# Patient Record
Sex: Female | Born: 1975 | Hispanic: Yes | Marital: Married | Smoking: Never smoker
Health system: Southern US, Community
[De-identification: ages and names within clinical notes are randomized; demographics above are authoritative.]

## PROBLEM LIST (undated history)

## (undated) DIAGNOSIS — J45909 Unspecified asthma, uncomplicated: Secondary | ICD-10-CM

---

## 2004-05-02 ENCOUNTER — Ambulatory Visit: Payer: Self-pay | Admitting: Family Medicine

## 2004-10-04 ENCOUNTER — Observation Stay: Payer: Self-pay

## 2004-10-05 ENCOUNTER — Inpatient Hospital Stay: Payer: Self-pay | Admitting: Obstetrics and Gynecology

## 2006-08-19 ENCOUNTER — Ambulatory Visit: Payer: Self-pay | Admitting: Family Medicine

## 2007-01-17 ENCOUNTER — Ambulatory Visit: Payer: Self-pay | Admitting: Obstetrics and Gynecology

## 2007-01-20 ENCOUNTER — Inpatient Hospital Stay: Payer: Self-pay | Admitting: Obstetrics and Gynecology

## 2009-11-14 ENCOUNTER — Encounter: Payer: Self-pay | Admitting: Obstetrics and Gynecology

## 2009-12-12 ENCOUNTER — Encounter: Payer: Self-pay | Admitting: Maternal and Fetal Medicine

## 2010-01-16 ENCOUNTER — Encounter: Payer: Self-pay | Admitting: Maternal & Fetal Medicine

## 2010-06-07 ENCOUNTER — Inpatient Hospital Stay: Payer: Self-pay | Admitting: Obstetrics and Gynecology

## 2010-06-12 LAB — PATHOLOGY REPORT

## 2015-03-18 DIAGNOSIS — N920 Excessive and frequent menstruation with regular cycle: Secondary | ICD-10-CM | POA: Diagnosis present

## 2015-03-18 DIAGNOSIS — K5712 Diverticulitis of small intestine without perforation or abscess without bleeding: Principal | ICD-10-CM | POA: Diagnosis present

## 2015-03-19 ENCOUNTER — Emergency Department: Payer: Self-pay

## 2015-03-19 ENCOUNTER — Inpatient Hospital Stay
Admission: EM | Admit: 2015-03-19 | Discharge: 2015-03-20 | DRG: 392 | Disposition: A | Discharge status: Home / Self Care | Payer: Self-pay | Attending: Surgery | Admitting: Surgery

## 2015-03-19 ENCOUNTER — Other Ambulatory Visit: Payer: Self-pay

## 2015-03-19 ENCOUNTER — Inpatient Hospital Stay: Payer: Self-pay

## 2015-03-19 DIAGNOSIS — K56609 Unspecified intestinal obstruction, unspecified as to partial versus complete obstruction: Secondary | ICD-10-CM | POA: Diagnosis present

## 2015-03-19 DIAGNOSIS — R1032 Left lower quadrant pain: Secondary | ICD-10-CM

## 2015-03-19 DIAGNOSIS — R109 Unspecified abdominal pain: Secondary | ICD-10-CM | POA: Insufficient documentation

## 2015-03-19 DIAGNOSIS — K5669 Other intestinal obstruction: Secondary | ICD-10-CM

## 2015-03-19 HISTORY — DX: Unspecified asthma, uncomplicated: J45.909

## 2015-03-19 LAB — COMPREHENSIVE METABOLIC PANEL
ALK PHOS: 46 U/L (ref 38–126)
ALT: 18 U/L (ref 14–54)
ANION GAP: 9 (ref 5–15)
AST: 20 U/L (ref 15–41)
Albumin: 4 g/dL (ref 3.5–5.0)
BILIRUBIN TOTAL: 0.7 mg/dL (ref 0.3–1.2)
BUN: 24 mg/dL — ABNORMAL HIGH (ref 6–20)
CALCIUM: 9.1 mg/dL (ref 8.9–10.3)
CO2: 24 mmol/L (ref 22–32)
CREATININE: 0.64 mg/dL (ref 0.44–1.00)
Chloride: 105 mmol/L (ref 101–111)
Glucose, Bld: 178 mg/dL — ABNORMAL HIGH (ref 65–99)
Potassium: 3.6 mmol/L (ref 3.5–5.1)
SODIUM: 138 mmol/L (ref 135–145)
Total Protein: 7.1 g/dL (ref 6.5–8.1)

## 2015-03-19 LAB — URINALYSIS COMPLETE WITH MICROSCOPIC (ARMC ONLY)
Bilirubin Urine: NEGATIVE
Glucose, UA: NEGATIVE mg/dL
Hgb urine dipstick: NEGATIVE
Leukocytes, UA: NEGATIVE
Nitrite: NEGATIVE
PROTEIN: 100 mg/dL — AB
Specific Gravity, Urine: 1.028 (ref 1.005–1.030)
pH: 8 (ref 5.0–8.0)

## 2015-03-19 LAB — DIFFERENTIAL
BASOS ABS: 0.1 10*3/uL (ref 0–0.1)
Basophils Relative: 1 %
Eosinophils Absolute: 0 10*3/uL (ref 0–0.7)
Eosinophils Relative: 0 %
LYMPHS PCT: 9 %
Lymphs Abs: 1.3 10*3/uL (ref 1.0–3.6)
Monocytes Absolute: 0.5 10*3/uL (ref 0.2–0.9)
Monocytes Relative: 3 %
NEUTROS ABS: 13.7 10*3/uL — AB (ref 1.4–6.5)
NEUTROS PCT: 87 %

## 2015-03-19 LAB — CHLAMYDIA/NGC RT PCR (ARMC ONLY)
CHLAMYDIA TR: NOT DETECTED
N GONORRHOEAE: NOT DETECTED

## 2015-03-19 LAB — CBC
HEMATOCRIT: 26.8 % — AB (ref 35.0–47.0)
HEMOGLOBIN: 8 g/dL — AB (ref 12.0–16.0)
MCH: 18.7 pg — AB (ref 26.0–34.0)
MCHC: 30.1 g/dL — AB (ref 32.0–36.0)
MCV: 62.2 fL — ABNORMAL LOW (ref 80.0–100.0)
Platelets: 352 10*3/uL (ref 150–440)
RBC: 4.3 MIL/uL (ref 3.80–5.20)
RDW: 18.5 % — AB (ref 11.5–14.5)
WBC: 15.3 10*3/uL — ABNORMAL HIGH (ref 3.6–11.0)

## 2015-03-19 LAB — POCT PREGNANCY, URINE: PREG TEST UR: NEGATIVE

## 2015-03-19 LAB — WET PREP, GENITAL
Clue Cells Wet Prep HPF POC: NONE SEEN
SPERM: NONE SEEN
Trich, Wet Prep: NONE SEEN
YEAST WET PREP: NONE SEEN

## 2015-03-19 LAB — LIPASE, BLOOD: Lipase: 33 U/L (ref 11–51)

## 2015-03-19 MED ORDER — ONDANSETRON HCL 4 MG PO TABS: 4.0000 mg | ORAL_TABLET | Freq: Four times a day (QID) | ORAL | Status: DC | PRN

## 2015-03-19 MED ORDER — IOHEXOL 300 MG/ML  SOLN
100.0000 mL | Freq: Once | INTRAMUSCULAR | Status: AC | PRN
  Administered 2015-03-19: 100 mL via INTRAVENOUS

## 2015-03-19 MED ORDER — MORPHINE SULFATE (PF) 2 MG/ML IV SOLN
2.0000 mg | INTRAVENOUS | Status: DC | PRN
  Administered 2015-03-19 – 2015-03-20 (×4): 2 mg via INTRAVENOUS
  Filled 2015-03-19 (×4): qty 1

## 2015-03-19 MED ORDER — PANTOPRAZOLE SODIUM 40 MG IV SOLR
40.0000 mg | Freq: Two times a day (BID) | INTRAVENOUS | Status: DC
  Administered 2015-03-19 – 2015-03-20 (×3): 40 mg via INTRAVENOUS
  Filled 2015-03-19 (×3): qty 40

## 2015-03-19 MED ORDER — MORPHINE SULFATE (PF) 4 MG/ML IV SOLN
4.0000 mg | Freq: Once | INTRAVENOUS | Status: AC
  Administered 2015-03-19: 4 mg via INTRAVENOUS
  Filled 2015-03-19: qty 1

## 2015-03-19 MED ORDER — IOHEXOL 240 MG/ML SOLN
25.0000 mL | Freq: Once | INTRAMUSCULAR | Status: AC | PRN
  Administered 2015-03-19: 25 mL via ORAL

## 2015-03-19 MED ORDER — ONDANSETRON HCL 4 MG/2ML IJ SOLN: 4.0000 mg | Freq: Four times a day (QID) | INTRAMUSCULAR | Status: DC | PRN

## 2015-03-19 MED ORDER — KCL IN DEXTROSE-NACL 20-5-0.45 MEQ/L-%-% IV SOLN
INTRAVENOUS | Status: DC
  Administered 2015-03-19 – 2015-03-20 (×3): via INTRAVENOUS
  Filled 2015-03-19 (×6): qty 1000

## 2015-03-19 MED ORDER — MORPHINE SULFATE (PF) 4 MG/ML IV SOLN
INTRAVENOUS | Status: AC
  Administered 2015-03-19: 4 mg
  Filled 2015-03-19: qty 1

## 2015-03-19 MED ORDER — DEXTROSE 5 % IV SOLN
1.0000 g | Freq: Four times a day (QID) | INTRAVENOUS | Status: DC
  Administered 2015-03-19 – 2015-03-20 (×6): 1 g via INTRAVENOUS
  Filled 2015-03-19 (×11): qty 1

## 2015-03-19 MED ORDER — ONDANSETRON 8 MG PO TBDP
8.0000 mg | ORAL_TABLET | Freq: Once | ORAL | Status: AC
  Administered 2015-03-19: 8 mg via ORAL
  Filled 2015-03-19: qty 1

## 2015-03-19 MED ORDER — HEPARIN SODIUM (PORCINE) 5000 UNIT/ML IJ SOLN
5000.0000 [IU] | Freq: Three times a day (TID) | INTRAMUSCULAR | Status: DC
  Administered 2015-03-19 – 2015-03-20 (×3): 5000 [IU] via SUBCUTANEOUS
  Filled 2015-03-19 (×3): qty 1

## 2015-03-19 MED ORDER — KETOROLAC TROMETHAMINE 30 MG/ML IJ SOLN
30.0000 mg | Freq: Once | INTRAMUSCULAR | Status: AC
  Administered 2015-03-19: 30 mg via INTRAVENOUS
  Filled 2015-03-19: qty 1

## 2015-03-19 MED ORDER — SODIUM CHLORIDE 0.9 % IV BOLUS (SEPSIS)
1000.0000 mL | Freq: Once | INTRAVENOUS | Status: AC
  Administered 2015-03-19: 1000 mL via INTRAVENOUS

## 2015-03-19 MED ORDER — FENTANYL CITRATE (PF) 100 MCG/2ML IJ SOLN
50.0000 ug | Freq: Once | INTRAMUSCULAR | Status: AC
  Administered 2015-03-19: 50 ug via INTRAVENOUS
  Filled 2015-03-19: qty 2

## 2015-03-19 MED ORDER — HEPARIN SODIUM (PORCINE) 5000 UNIT/ML IJ SOLN
INTRAMUSCULAR | Status: AC
  Filled 2015-03-19: qty 1

## 2015-03-19 MED ORDER — GI COCKTAIL ~~LOC~~
30.0000 mL | Freq: Once | ORAL | Status: AC
  Administered 2015-03-19: 30 mL via ORAL
  Filled 2015-03-19: qty 30

## 2015-03-19 NOTE — ED Notes (Signed)
Report given to Velva HarmanLaura RN for room 222

## 2015-03-19 NOTE — ED Notes (Signed)
Patient transported to Ultrasound 

## 2015-03-19 NOTE — Progress Notes (Addendum)
14 Fr NG was inserted and hooked up to low- intermit suction. Dr. Excell Seltzerooper stated no x-ray needed after placement of NG tube. Pt tolerated NG insertion well and expressed to RN that she felt relief once inserted. Second verification of placement was done by Adelina MingsKim Bundren, RN.   Stacey Meyer

## 2015-03-19 NOTE — ED Notes (Signed)
Pt returned from US, c/o pain, informed Dr Alphonzo LemmingsMcShane.

## 2015-03-19 NOTE — Progress Notes (Signed)
Pt arrived on unit. VSS.  Stacey Meyer   

## 2015-03-19 NOTE — ED Notes (Addendum)
Patient transported to CT 

## 2015-03-19 NOTE — Progress Notes (Signed)
I visited the patient again in the MedSurg unit. I reviewed the patient's history and via an interpreter we discussed the patient's care. She states that her pain is better. Less pain this afternoon.  Vital signs are stable. Abdomen is soft nondistended nontympanitic and virtually nontender with the exception of some very minimal tenderness in the left upper quadrant or lateral left side with no peritoneal signs including no guarding no rebound no percussion tenderness.  Via an interpreter I discussed the rationale for placement of a nasogastric tube based on the patient's CT findings she continues to be without emesis but I believe that because of the fluid and the bowels she may benefit from decompression. This is reviewed with her she understood and agreed with plan for nasogastric tube placement

## 2015-03-19 NOTE — ED Notes (Signed)
Pt with mid abd pain to epigastric pain that began approx 2 hours pta. Pt pale, squirming in pain in triage.

## 2015-03-19 NOTE — ED Notes (Signed)
Patient transported to CT 

## 2015-03-19 NOTE — H&P (Signed)
Stacey Meyer is an 40 y.o. female.    Chief Complaint: Left-sided abdominal pain  HPI: This patient with several days of left sided abdominal pain she points somewhat to the left lower quadrant it is not worsened but is not going away. A nice melena or hematochezia and has never had an episode like this before denies fevers or chills but she has not vomited but is slightly nauseated. She takes no medications and has had only a C-section for prior surgery.  Past Medical History  Diagnosis Date  . Asthma     No past surgical history on file.  No family history on file. Social History:  has no tobacco, alcohol, and drug history on file.  Allergies: No Known Allergies   (Not in a hospital admission)   Review of Systems  Constitutional: Negative for fever and chills.  HENT: Negative.   Eyes: Negative.   Respiratory: Negative.   Cardiovascular: Negative.   Gastrointestinal: Positive for nausea and abdominal pain. Negative for heartburn, vomiting, diarrhea, constipation, blood in stool and melena.       Passing gas  Genitourinary: Negative.   Musculoskeletal: Negative.   Skin: Negative.   Neurological: Negative.   Endo/Heme/Allergies: Negative.   Psychiatric/Behavioral: Negative.      Physical Exam:  BP 128/76 mmHg  Pulse 81  Temp(Src) 98.6 F (37 C) (Oral)  Resp 18  Ht 5' 5"  (1.651 m)  Wt 160 lb (72.576 kg)  BMI 26.63 kg/m2  SpO2 100%  LMP 03/17/2015  Physical Exam  Constitutional: She is oriented to person, place, and time and well-developed, well-nourished, and in no distress. No distress.  HENT:  Head: Normocephalic and atraumatic.  Eyes: Pupils are equal, round, and reactive to light. Right eye exhibits no discharge. Left eye exhibits no discharge. No scleral icterus.  Neck: Normal range of motion. Neck supple.  Cardiovascular: Normal rate, regular rhythm and normal heart sounds.   Pulmonary/Chest: Effort normal and breath sounds normal. No respiratory  distress. She has no wheezes. She has no rales.  Abdominal: Soft. She exhibits no distension. There is tenderness. There is no rebound and no guarding.  Very minimal tenderness in the left abdomen abdomen somewhat on the left lower quadrant no guarding no rebound no percussion tenderness patient is nondistended nontympanitic  Musculoskeletal: Normal range of motion. She exhibits no edema or tenderness.  Lymphadenopathy:    She has no cervical adenopathy.  Neurological: She is alert and oriented to person, place, and time.  Skin: Skin is warm and dry. No rash noted. She is not diaphoretic. No erythema.  Psychiatric: Mood and affect normal.  Vitals reviewed.       Results for orders placed or performed during the hospital encounter of 03/19/15 (from the past 48 hour(s))  Lipase, blood     Status: None   Collection Time: 03/19/15 12:06 AM  Result Value Ref Range   Lipase 33 11 - 51 U/L  Comprehensive metabolic panel     Status: Abnormal   Collection Time: 03/19/15 12:06 AM  Result Value Ref Range   Sodium 138 135 - 145 mmol/L   Potassium 3.6 3.5 - 5.1 mmol/L   Chloride 105 101 - 111 mmol/L   CO2 24 22 - 32 mmol/L   Glucose, Bld 178 (H) 65 - 99 mg/dL   BUN 24 (H) 6 - 20 mg/dL   Creatinine, Ser 0.64 0.44 - 1.00 mg/dL   Calcium 9.1 8.9 - 10.3 mg/dL   Total Protein 7.1 6.5 -  8.1 g/dL   Albumin 4.0 3.5 - 5.0 g/dL   AST 20 15 - 41 U/L   ALT 18 14 - 54 U/L   Alkaline Phosphatase 46 38 - 126 U/L   Total Bilirubin 0.7 0.3 - 1.2 mg/dL   GFR calc non Af Amer >60 >60 mL/min   GFR calc Af Amer >60 >60 mL/min    Comment: (NOTE) The eGFR has been calculated using the CKD EPI equation. This calculation has not been validated in all clinical situations. eGFR's persistently <60 mL/min signify possible Chronic Kidney Disease.    Anion gap 9 5 - 15  CBC     Status: Abnormal   Collection Time: 03/19/15 12:06 AM  Result Value Ref Range   WBC 15.3 (H) 3.6 - 11.0 K/uL   RBC 4.30 3.80 - 5.20  MIL/uL   Hemoglobin 8.0 (L) 12.0 - 16.0 g/dL   HCT 26.8 (L) 35.0 - 47.0 %   MCV 62.2 (L) 80.0 - 100.0 fL   MCH 18.7 (L) 26.0 - 34.0 pg   MCHC 30.1 (L) 32.0 - 36.0 g/dL   RDW 18.5 (H) 11.5 - 14.5 %   Platelets 352 150 - 440 K/uL  Differential     Status: Abnormal   Collection Time: 03/19/15 12:06 AM  Result Value Ref Range   Neutrophils Relative % 87 %   Neutro Abs 13.7 (H) 1.4 - 6.5 K/uL   Lymphocytes Relative 9 %   Lymphs Abs 1.3 1.0 - 3.6 K/uL   Monocytes Relative 3 %   Monocytes Absolute 0.5 0.2 - 0.9 K/uL   Eosinophils Relative 0 %   Eosinophils Absolute 0.0 0 - 0.7 K/uL   Basophils Relative 1 %   Basophils Absolute 0.1 0 - 0.1 K/uL  Urinalysis complete, with microscopic ([facility] only)     Status: Abnormal   Collection Time: 03/19/15  1:11 AM  Result Value Ref Range   Color, Urine YELLOW (A) YELLOW   APPearance HAZY (A) CLEAR   Glucose, UA NEGATIVE NEGATIVE mg/dL   Bilirubin Urine NEGATIVE NEGATIVE   Ketones, ur 2+ (A) NEGATIVE mg/dL   Specific Gravity, Urine 1.028 1.005 - 1.030   Hgb urine dipstick NEGATIVE NEGATIVE   pH 8.0 5.0 - 8.0   Protein, ur 100 (A) NEGATIVE mg/dL   Nitrite NEGATIVE NEGATIVE   Leukocytes, UA NEGATIVE NEGATIVE   RBC / HPF 0-5 0 - 5 RBC/hpf   WBC, UA 6-30 0 - 5 WBC/hpf   Bacteria, UA RARE (A) NONE SEEN   Squamous Epithelial / LPF 0-5 (A) NONE SEEN   Mucous PRESENT   Pregnancy, urine POC     Status: None   Collection Time: 03/19/15  1:15 AM  Result Value Ref Range   Preg Test, Ur NEGATIVE NEGATIVE    Comment:        THE SENSITIVITY OF THIS METHODOLOGY IS >24 mIU/mL   Chlamydia/NGC rt PCR     Status: None   Collection Time: 03/19/15  4:13 AM  Result Value Ref Range   Specimen source GC/Chlam ENDOCERVICAL    Chlamydia Tr NOT DETECTED NOT DETECTED   N gonorrhoeae NOT DETECTED NOT DETECTED    Comment: (NOTE) 100  This methodology has not been evaluated in pregnant women or in 200  patients with a history of hysterectomy. 300 400  This  methodology will not be performed on patients less than 85  years of age.   Wet prep, genital     Status: Abnormal  Collection Time: 03/19/15  4:16 AM  Result Value Ref Range   Yeast Wet Prep HPF POC NONE SEEN NONE SEEN   Trich, Wet Prep NONE SEEN NONE SEEN   Clue Cells Wet Prep HPF POC NONE SEEN NONE SEEN   WBC, Wet Prep HPF POC MODERATE (A) NONE SEEN   Sperm NONE SEEN    US Transvaginal Non-ob  03/19/2015  CLINICAL DATA:  40 year old female with left lower quadrant abdominal pain EXAM: TRANSABDOMINAL AND TRANSVAGINAL ULTRASOUND OF PELVIS TECHNIQUE: Both transabdominal and transvaginal ultrasound examinations of the pelvis were performed. Transabdominal technique was performed for global imaging of the pelvis including uterus, ovaries, adnexal regions, and pelvic cul-de-sac. It was necessary to proceed with endovaginal exam following the transabdominal exam to visualize the endometrium and the ovaries. COMPARISON:  None FINDINGS: Uterus Measurements: 9.8 x 5.1 x 6.5 cm. No fibroids or other mass visualized. Endometrium Thickness: 7 mm.  No focal abnormality visualized. Right ovary Mild enlarged measuring 4.2 x 2.2 x 2.6 cm. Normal appearance/no adnexal mass. Left ovary Mildly enlarged measuring 3.5 x 2.5 x 2.6 cm. Normal appearance/no adnexal mass. Multiple bilateral ovarian follicles noted. Other findings Moderate free fluid noted within the pelvis. IMPRESSION: Mild enlargement of the ovaries with moderate free fluid within the pelvis. Unremarkable uterus. Electronically Signed   By: Anner Crete M.D.   On: 03/19/2015 03:45   US Pelvis Complete  03/19/2015  CLINICAL DATA:  40 year old female with left lower quadrant abdominal pain EXAM: TRANSABDOMINAL AND TRANSVAGINAL ULTRASOUND OF PELVIS TECHNIQUE: Both transabdominal and transvaginal ultrasound examinations of the pelvis were performed. Transabdominal technique was performed for global imaging of the pelvis including uterus, ovaries, adnexal  regions, and pelvic cul-de-sac. It was necessary to proceed with endovaginal exam following the transabdominal exam to visualize the endometrium and the ovaries. COMPARISON:  None FINDINGS: Uterus Measurements: 9.8 x 5.1 x 6.5 cm. No fibroids or other mass visualized. Endometrium Thickness: 7 mm.  No focal abnormality visualized. Right ovary Mild enlarged measuring 4.2 x 2.2 x 2.6 cm. Normal appearance/no adnexal mass. Left ovary Mildly enlarged measuring 3.5 x 2.5 x 2.6 cm. Normal appearance/no adnexal mass. Multiple bilateral ovarian follicles noted. Other findings Moderate free fluid noted within the pelvis. IMPRESSION: Mild enlargement of the ovaries with moderate free fluid within the pelvis. Unremarkable uterus. Electronically Signed   By: Anner Crete M.D.   On: 03/19/2015 03:45   Ct Abdomen Pelvis W Contrast  03/19/2015  ADDENDUM REPORT: 03/19/2015 07:00 ADDENDUM: These results were called by telephone at the time of interpretation on 03/19/2015 at 7:00 am to Dr. Charlotte Crumb , who verbally acknowledged these results. Electronically Signed   By: Anner Crete M.D.   On: 03/19/2015 07:00  03/19/2015  CLINICAL DATA:  40 year old female with abdominal pain. EXAM: CT ABDOMEN AND PELVIS WITH CONTRAST TECHNIQUE: Multidetector CT imaging of the abdomen and pelvis was performed using the standard protocol following bolus administration of intravenous contrast. CONTRAST:  145m OMNIPAQUE IOHEXOL 300 MG/ML  SOLN COMPARISON:  None. FINDINGS: The visualized lung bases are clear. No intra-abdominal free air. Small ascites. The liver, pancreas, spleen, adrenal glands, kidneys, visualized ureters appear unremarkable. There is apparent diffuse thickening of the bladder wall which may be partly related to underdistention. Cystitis is not excluded. Correlation with urinalysis recommended. The uterus is anteverted and appears grossly unremarkable. The ovaries appear unremarkable. Oral contrast is noted within the  stomach. No evidence of gastric outlet obstruction. There is a focal area of mesentery twisting in  the left mid abdomen best seen on coronal series 6 images 38 -55. There is stranding of the lower abdominal mesentery and pelvic fat. There is mild prominence of fluid-filled loop of small bowel proximal to this area. The small bowel distal to this area are decompressed. There is an area of obstruction within the small bowel (series 6, image 48 and series 2, image 56). Findings may be related to a mesenteric volvulus. An internal hernia is not excluded. Clinical correlation and surgical consult is advised. Small amount of gas in the periphery of the lumen of this loop of small bowel just proximal to the obstruction is likely intraluminal and less likely pneumatosis. Constipation. The appendix is not visualized with certainty. No inflammatory changes identified in the right lower quadrant. The abdominal aorta and IVC appear patent. No portal venous gas identified. There is no adenopathy. Small fat containing umbilical hernia. The abdominal wall soft tissues are otherwise unremarkable. Bilateral L5 pars defects. No acute fracture. IMPRESSION: Small-bowel obstruction with transition zone in the left mid hemiabdomen secondary to focal mesenteric twisting. 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 An internal hernia is not excluded. Clinical correlation and surgical consult is advised. No portal venous gas. Small ascites. Electronically Signed: By: Anner Crete M.D. On: 03/19/2015 06:58     Assessment/Plan  CT scan personally reviewed. Scout film shows no obvious sign of small bowel obstruction and the patient has not been vomiting but the CT scan shows dilated loops of bowel. I also see signs that could represent early acute diverticulitis in the pelvis. Because the patient's etiology of her pain is unclear and I am not convinced that this is a internal hernia I would like to place a nasogastric tube and start  empiric antibiotic's and hydrate the patient and have her come in the hospital for reexamination she was understanding and agreement with this process  Florene Glen, MD, FACS

## 2015-03-19 NOTE — ED Notes (Signed)
Pt up to bedside comode

## 2015-03-19 NOTE — ED Provider Notes (Addendum)
[facility] Emergency Department Provider Note  ____________________________________________   I have reviewed the triage vital signs and the nursing notes.   HISTORY  Chief Complaint Abdominal Pain    HPI Stacey Meyer is a 40 y.o. female history per patient, interpreter present. Patient with C-section as her only prior surgical history. Does have a history of very heavy menstrual periods which is finishing at this time. This was a normal period for her. She is complaining of a fuse abdominal pain which started at 6:00 last night. No vomiting. No diarrhea. Has not had pain like this before. The pain is significant. She denies any fever or chills. She denies any cough shortness of breath or chest pain. Sometimes the pain seems epigastric and sometimes it seems in the lower abdomen.  Past Medical History  Diagnosis Date  . Asthma     There are no active problems to display for this patient.   No past surgical history on file.  No current outpatient prescriptions on file.  Allergies Review of patient's allergies indicates no known allergies.  No family history on file.  Social History Social History  Substance Use Topics  . Smoking status: None  . Smokeless tobacco: None  . Alcohol Use: None    Review of Systems Constitutional: No fever/chills Eyes: No visual changes. ENT: No sore throat. No stiff neck no neck pain Cardiovascular: Denies chest pain. Respiratory: Denies shortness of breath. Gastrointestinal:   no vomiting.  No diarrhea.  No constipation. Genitourinary: Negative for dysuria. Musculoskeletal: Negative lower extremity swelling Skin: Negative for rash. Neurological: Negative for headaches, focal weakness or numbness. 10-point ROS otherwise negative.  ____________________________________________   PHYSICAL EXAM:  VITAL SIGNS: ED Triage Vitals  Enc Vitals Group     BP 03/19/15 0002 102/86 mmHg     Pulse Rate 03/19/15 0002  102     Resp 03/19/15 0002 22     Temp 03/19/15 0002 98.6 F (37 C)     Temp Source 03/19/15 0002 Oral     SpO2 03/19/15 0002 100 %     Weight 03/19/15 0002 160 lb (72.576 kg)     Height 03/19/15 0002 5\' 5"  (1.651 m)     Head Cir --      Peak Flow --      Pain Score 03/19/15 0003 10     Pain Loc --      Pain Edu? --      Excl. in GC? --     Constitutional: Alert and oriented appears uncomfortable Eyes: Conjunctivae are normal. PERRL. EOMI. Head: Atraumatic. Nose: No congestion/rhinnorhea. Mouth/Throat: Mucous membranes are moist.  Oropharynx non-erythematous. Neck: No stridor.   Nontender with no meningismus Cardiovascular: Normal rate, regular rhythm. Grossly normal heart sounds.  Good peripheral circulation. Respiratory: Normal respiratory effort.  No retractions. Lungs CTAB. Abdominal: Soft diffusely tender with no focal guarding or rebound. Back:  There is no focal tenderness or step off there is no midline tenderness there are no lesions noted. there is no CVA tenderness Pelvic exam: Female nurse chaperone present, no external lesions noted, physiologic vaginal discharge noted with no purulent discharge, no cervical motion tenderness, no adnexal tenderness or mass, there is no significant uterine tenderness or mass. Scant dark vaginal bleeding noted Musculoskeletal: No lower extremity tenderness. No joint effusions, no DVT signs strong distal pulses no edema Neurologic:  Normal speech and language. No gross focal neurologic deficits are appreciated.  Skin:  Skin is warm, dry and intact. No  rash noted. Psychiatric: Mood and affect are normal. Speech and behavior are normal.  ____________________________________________   LABS (all labs ordered are listed, but only abnormal results are displayed)  Labs Reviewed  WET PREP, GENITAL - Abnormal; Notable for the following:    WBC, Wet Prep HPF POC MODERATE (*)    All other components within normal limits  COMPREHENSIVE  METABOLIC PANEL - Abnormal; Notable for the following:    Glucose, Bld 178 (*)    BUN 24 (*)    All other components within normal limits  CBC - Abnormal; Notable for the following:    WBC 15.3 (*)    Hemoglobin 8.0 (*)    HCT 26.8 (*)    MCV 62.2 (*)    MCH 18.7 (*)    MCHC 30.1 (*)    RDW 18.5 (*)    All other components within normal limits  URINALYSIS COMPLETEWITH MICROSCOPIC ([facility] ONLY) - Abnormal; Notable for the following:    Color, Urine YELLOW (*)    APPearance HAZY (*)    Ketones, ur 2+ (*)    Protein, ur 100 (*)    Bacteria, UA RARE (*)    Squamous Epithelial / LPF 0-5 (*)    All other components within normal limits  DIFFERENTIAL - Abnormal; Notable for the following:    Neutro Abs 13.7 (*)    All other components within normal limits  CHLAMYDIA/NGC RT PCR ([facility] ONLY)  LIPASE, BLOOD  POC URINE PREG, ED  POCT PREGNANCY, URINE   ____________________________________________  EKG  I personally interpreted any EKGs ordered by me or triage Sinus rhythm rate 66 bpm no acute ST elevation or acute ST depression unremarkable EKG aside from borderline long QT ____________________________________________  RADIOLOGY  I reviewed any imaging ordered by me or triage that were performed during my shift ____________________________________________   PROCEDURES  Procedure(s) performed: None  Critical Care performed: None  ____________________________________________   INITIAL IMPRESSION / ASSESSMENT AND PLAN / ED COURSE  Pertinent labs & imaging results that were available during my care of the patient were reviewed by me and considered in my medical decision making (see chart for details). Patient with diffuse abdominal pain nonsurgical abdomen at this time but persistent discomfort lead to a large workup which resulted in discovering a unexpected SBO. There was no vomiting, patient states her only past surgical history is a C-section. I did discuss with  Dr. Excell Seltzerooper, who agrees with management and will admit the patient. We do noticed that the patient is anemic. This is a microcytic and likely chronic anemia from her heavy periods. There is no complaint of GI bleeding or melena, vital signs are reassuring. ____________________________________________   FINAL CLINICAL IMPRESSION(S) / ED DIAGNOSES  Final diagnoses:  Abdominal pain    Jeanmarie PlantJames A Daunte Oestreich, MD 03/19/15 0725  Jeanmarie PlantJames A Kalvin Buss, MD 03/19/15 [id number]

## 2015-03-20 ENCOUNTER — Inpatient Hospital Stay: Payer: Self-pay

## 2015-03-20 LAB — CBC
HEMATOCRIT: 24.6 % — AB (ref 35.0–47.0)
HEMOGLOBIN: 7.3 g/dL — AB (ref 12.0–16.0)
MCH: 18.7 pg — AB (ref 26.0–34.0)
MCHC: 29.7 g/dL — AB (ref 32.0–36.0)
MCV: 63 fL — ABNORMAL LOW (ref 80.0–100.0)
Platelets: 314 10*3/uL (ref 150–440)
RBC: 3.91 MIL/uL (ref 3.80–5.20)
RDW: 18.6 % — AB (ref 11.5–14.5)
WBC: 8.9 10*3/uL (ref 3.6–11.0)

## 2015-03-20 LAB — BASIC METABOLIC PANEL
ANION GAP: 4 — AB (ref 5–15)
BUN: 8 mg/dL (ref 6–20)
CO2: 26 mmol/L (ref 22–32)
Calcium: 8.1 mg/dL — ABNORMAL LOW (ref 8.9–10.3)
Chloride: 109 mmol/L (ref 101–111)
Creatinine, Ser: 0.53 mg/dL (ref 0.44–1.00)
GFR calc Af Amer: >60 mL/min (ref >60)
Glucose, Bld: 128 mg/dL — ABNORMAL HIGH (ref 65–99)
POTASSIUM: 3.7 mmol/L (ref 3.5–5.1)
SODIUM: 139 mmol/L (ref 135–145)

## 2015-03-20 MED ORDER — METRONIDAZOLE 500 MG PO TABS: 500.0000 mg | ORAL_TABLET | Freq: Three times a day (TID) | ORAL | Status: DC

## 2015-03-20 MED ORDER — ACETAMINOPHEN 325 MG PO TABS
650.0000 mg | ORAL_TABLET | Freq: Once | ORAL | Status: AC
  Administered 2015-03-20: 650 mg via ORAL
  Filled 2015-03-20: qty 2

## 2015-03-20 MED ORDER — CIPROFLOXACIN HCL 500 MG PO TABS: 500.0000 mg | ORAL_TABLET | Freq: Two times a day (BID) | ORAL | Status: DC

## 2015-03-20 NOTE — Discharge Instructions (Signed)
Resume home medications Take antibiotics as ordered Follow-up with Dr. Excell Seltzerooper in 10 days Return to the emergency room should she have fevers chills or worsening abdominal pain

## 2015-03-20 NOTE — Progress Notes (Signed)
CC: SBO Subjective: 's patient admitted with a diagnosis of partial small bowel obstruction versus possible diverticulitis. She states that her pain is much better today and almost gone she is passing gas and no nausea or vomiting.  Objective: Vital signs in last 24 hours: Temp:  [98.2 F (36.8 C)-98.8 F (37.1 C)] 98.2 F (36.8 C) (01/15 0402) Pulse Rate:  [76-89] 79 (01/15 0402) Resp:  [16-20] 20 (01/15 0402) BP: (135-143)/(79-83) 136/79 mmHg (01/15 0402) SpO2:  [100 %] 100 % (01/15 0402) Last BM Date: 03/19/15  Intake/Output from previous day: 01/14 0701 - 01/15 0700 In: 1652.1 [I.V.:1562.1; NG/GT:90] Out: 2480 [Urine:1800; Emesis/NG output:680] Intake/Output this shift: Total I/O In: 258 [I.V.:228; NG/GT:30] Out: 450 [Urine:450]  Physical exam:  Abdomen is soft nondistended nontympanitic and nontender nontender calves awake alert oriented  Lab Results: CBC   Recent Labs  03/19/15 0006 03/20/15 0415  WBC 15.3* 8.9  HGB 8.0* 7.3*  HCT 26.8* 24.6*  PLT 352 314   BMET  Recent Labs  03/19/15 0006 03/20/15 0415  NA 138 139  K 3.6 3.7  CL 105 109  CO2 24 26  GLUCOSE 178* 128*  BUN 24* 8  CREATININE 0.64 0.53  CALCIUM 9.1 8.1*   PT/INR No results for input(s): LABPROT, INR in the last 72 hours. ABG No results for input(s): PHART, HCO3 in the last 72 hours.  Invalid input(s): PCO2, PO2  Studies/Results: US Transvaginal Non-ob  03/19/2015  CLINICAL DATA:  40 year old female with left lower quadrant abdominal pain EXAM: TRANSABDOMINAL AND TRANSVAGINAL ULTRASOUND OF PELVIS TECHNIQUE: Both transabdominal and transvaginal ultrasound examinations of the pelvis were performed. Transabdominal technique was performed for global imaging of the pelvis including uterus, ovaries, adnexal regions, and pelvic cul-de-sac. It was necessary to proceed with endovaginal exam following the transabdominal exam to visualize the endometrium and the ovaries. COMPARISON:  None  FINDINGS: Uterus Measurements: 9.8 x 5.1 x 6.5 cm. No fibroids or other mass visualized. Endometrium Thickness: 7 mm.  No focal abnormality visualized. Right ovary Mild enlarged measuring 4.2 x 2.2 x 2.6 cm. Normal appearance/no adnexal mass. Left ovary Mildly enlarged measuring 3.5 x 2.5 x 2.6 cm. Normal appearance/no adnexal mass. Multiple bilateral ovarian follicles noted. Other findings Moderate free fluid noted within the pelvis. IMPRESSION: Mild enlargement of the ovaries with moderate free fluid within the pelvis. Unremarkable uterus. Electronically Signed   By: Elgie Collard M.D.   On: 03/19/2015 03:45   US Pelvis Complete  03/19/2015  CLINICAL DATA:  40 year old female with left lower quadrant abdominal pain EXAM: TRANSABDOMINAL AND TRANSVAGINAL ULTRASOUND OF PELVIS TECHNIQUE: Both transabdominal and transvaginal ultrasound examinations of the pelvis were performed. Transabdominal technique was performed for global imaging of the pelvis including uterus, ovaries, adnexal regions, and pelvic cul-de-sac. It was necessary to proceed with endovaginal exam following the transabdominal exam to visualize the endometrium and the ovaries. COMPARISON:  None FINDINGS: Uterus Measurements: 9.8 x 5.1 x 6.5 cm. No fibroids or other mass visualized. Endometrium Thickness: 7 mm.  No focal abnormality visualized. Right ovary Mild enlarged measuring 4.2 x 2.2 x 2.6 cm. Normal appearance/no adnexal mass. Left ovary Mildly enlarged measuring 3.5 x 2.5 x 2.6 cm. Normal appearance/no adnexal mass. Multiple bilateral ovarian follicles noted. Other findings Moderate free fluid noted within the pelvis. IMPRESSION: Mild enlargement of the ovaries with moderate free fluid within the pelvis. Unremarkable uterus. Electronically Signed   By: Elgie Collard M.D.   On: 03/19/2015 03:45   Ct Abdomen Pelvis W Contrast  03/19/2015  ADDENDUM REPORT: 03/19/2015 07:00 ADDENDUM: These results were called by telephone at the time of  interpretation on 03/19/2015 at 7:00 am to Dr. Ileana RoupJAMES MCSHANE , who verbally acknowledged these results. Electronically Signed   By: Elgie CollardArash  Radparvar M.D.   On: 03/19/2015 07:00  03/19/2015  CLINICAL DATA:  40 year old female with abdominal pain. EXAM: CT ABDOMEN AND PELVIS WITH CONTRAST TECHNIQUE: Multidetector CT imaging of the abdomen and pelvis was performed using the standard protocol following bolus administration of intravenous contrast. CONTRAST:  100mL OMNIPAQUE IOHEXOL 300 MG/ML  SOLN COMPARISON:  None. FINDINGS: The visualized lung bases are clear. No intra-abdominal free air. Small ascites. The liver, pancreas, spleen, adrenal glands, kidneys, visualized ureters appear unremarkable. There is apparent diffuse thickening of the bladder wall which may be partly related to underdistention. Cystitis is not excluded. Correlation with urinalysis recommended. The uterus is anteverted and appears grossly unremarkable. The ovaries appear unremarkable. Oral contrast is noted within the stomach. No evidence of gastric outlet obstruction. There is a focal area of mesentery twisting in the left mid abdomen best seen on coronal series 6 images 38 -55. There is stranding of the lower abdominal mesentery and pelvic fat. There is mild prominence of fluid-filled loop of small bowel proximal to this area. The small bowel distal to this area are decompressed. There is an area of obstruction within the small bowel (series 6, image 48 and series 2, image 56). Findings may be related to a mesenteric volvulus. An internal hernia is not excluded. Clinical correlation and surgical consult is advised. Small amount of gas in the periphery of the lumen of this loop of small bowel just proximal to the obstruction is likely intraluminal and less likely pneumatosis. Constipation. The appendix is not visualized with certainty. No inflammatory changes identified in the right lower quadrant. The abdominal aorta and IVC appear patent. No  portal venous gas identified. There is no adenopathy. Small fat containing umbilical hernia. The abdominal wall soft tissues are otherwise unremarkable. Bilateral L5 pars defects. No acute fracture. IMPRESSION: Small-bowel obstruction with transition zone in the left mid hemiabdomen secondary to focal mesenteric twisting. 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 An internal hernia is not excluded. Clinical correlation and surgical consult is advised. No portal venous gas. Small ascites. Electronically Signed: By: Elgie CollardArash  Radparvar M.D. On: 03/19/2015 06:58   Dg Abd 2 Views  03/19/2015  CLINICAL DATA:  Bilateral lower quadrant pain. EXAM: ABDOMEN - 2 VIEW COMPARISON:  CT 03/19/2015 FINDINGS: No free air beneath the hemidiaphragms.  Lung bases are clear. Oral contrast from CT same day has progressed minimally with the majority of the oral contrast remaining in the small bowel. No evidence of dilated loops of small bowel. There is gas and stool in the rectum. There is stool in the ascending colon. IMPRESSION: Minimal progression of oral contrast. No evidence of high-grade obstruction by plain film radiography. Electronically Signed   By: Genevive BiStewart  Edmunds M.D.   On: 03/19/2015 11:21    Anti-infectives: Anti-infectives    Start     Dose/Rate Route Frequency Ordered Stop   03/19/15 0830  cefOXitin (MEFOXIN) 1 g in dextrose 5 % 50 mL IVPB     1 g 100 mL/hr over 30 Minutes Intravenous 4 times per day 03/19/15 [id number]        Assessment/Plan:  [facility] is personally reviewed no sign of bowel obstruction official reading is delayed due to computer issues but personal review shows contrast in the colon. Will  discontinue NG tube and advance to clear liquid diet possibly home later today Lattie Haw, MD, FACS  03/20/2015

## 2015-03-20 NOTE — Progress Notes (Signed)
Patient not on floor. In radiology getting films area and we'll see later today.

## 2015-03-20 NOTE — Discharge Summary (Signed)
Physician Discharge Summary  Patient ID: Stacey Meyer MRN: [id number] DOB/AGE: Jul 31, 1975 40 y.o.  Admit date: 03/19/2015 Discharge date: 03/20/2015   Discharge Diagnoses:  Active Problems:   SBO (small bowel obstruction) [facility](HCC)   Procedures:none  Hospital Course: This a patient admitted the hospital with a history of abdominal pain in the left side she had had no nausea but will a workup in the emergency room suggested possible mesenteric volvulus but on my personal review of her CT scan I did not think that was the etiology of this as she had not been vomiting and that diverticulitis was likely a more probable cause. She was admitted the hospital nasogastric tube was placed and IV antibiotics were instituted. The nasogastric tube was ultimately removed as she passed gas and her pain resolved she is discharged in stable condition tolerating a clear liquid diet and advancing. She will be given oral Cipro and Flagyl and follow-up in my office in 10 days  Consults: none  Disposition:      Medication List    TAKE these medications        ciprofloxacin 500 MG tablet  Commonly known as:  CIPRO  Take 1 tablet (500 mg total) by mouth 2 (two) times daily.     metroNIDAZOLE 500 MG tablet  Commonly known as:  FLAGYL  Take 1 tablet (500 mg total) by mouth 3 (three) times daily.         Lattie Hawichard E Darien Kading, MD, FACS

## 2015-03-20 NOTE — Progress Notes (Signed)
Patient alert and oriented. No acute respiratory distress noted.  Medicated for headache which as been effective.  Orders received to remove NGT and advance patient's diet to clear liquids. Orders noted and carried through. No complaints of GI distress voiced and pt has tolerated PO intake.  Voiding w/o complaints.  Up in room as tolerated during shift.  Discharge orders received. Reviewed the instructions with the patient and her spouse utilizing the [employer] English as a second language teacher(Interpreter Alex used). Interpreter indicates that patient has verbalized understanding of these instructions. A copy of the instructions were given to the patient in Spanish. No acute distress noted at the time of discharge.

## 2015-03-20 NOTE — Progress Notes (Signed)
Patient feels better and is tolerating a liquid diet at this point passing gas having no nausea or vomiting. v SS afebrile  Soft nondistended nontender abdomen  We'll discharge today and follow-up in 10 days on Cipro and Flagyl.

## 2015-03-23 ENCOUNTER — Telehealth: Payer: Self-pay

## 2015-03-23 NOTE — Telephone Encounter (Signed)
Patient called stating that she was discharged from the hospital on Sunday 03/20/2015 due to having a bowel obstruction and Left Lower Quadrant pain. She stated that after she was discharged she has not been able to have a bowel movement due to constipation. I recommended for her to walk some to help her with her constipation. In addition, I recommended for her to start taking Miralax once a day and if she continued to have problems in the next two days to try Dulcolax twice a day. I told her that this should help her. However, I told her that if by Friday she still didn't have a bowel movement, to please give me a call. Patient understood and agreed. I told her that her follow up appointment with [place] will be on 04/05/2015 at 3:15 PM and that we have asked for an interpreter.

## 2015-04-05 ENCOUNTER — Ambulatory Visit (INDEPENDENT_AMBULATORY_CARE_PROVIDER_SITE_OTHER): Payer: Self-pay | Admitting: Surgery

## 2015-04-05 ENCOUNTER — Encounter: Payer: Self-pay | Admitting: Surgery

## 2015-04-05 ENCOUNTER — Ambulatory Visit: Payer: Self-pay | Admitting: Surgery

## 2015-04-05 VITALS — BP 161/98 | HR 70 | Temp 99.3°F | Ht 65.0 in | Wt 164.0 lb

## 2015-04-05 DIAGNOSIS — K56609 Unspecified intestinal obstruction, unspecified as to partial versus complete obstruction: Secondary | ICD-10-CM

## 2015-04-05 DIAGNOSIS — K5669 Other intestinal obstruction: Secondary | ICD-10-CM

## 2015-04-05 NOTE — Progress Notes (Signed)
Outpatient Surgical Follow Up  04/05/2015  Stacey Meyer is an 40 y.o. female.   CC: Small bowel obstruction spontaneously resolved  HPI: This a patient who is admitted the hospital with a diagnosis of bowel obstruction of unclear etiology who is spontaneously resolved her bowel obstruction and was discharged from the hospital. Currently via an interpreter she has no abdominal pain is eating well has no nausea vomiting fevers or chills and is having normal bowel movements she finishes her Flagyl today and finished her Cipro yesterday  Past Medical History  Diagnosis Date  . Asthma     Past Surgical History  Procedure Laterality Date  . Cesarean section      No family history on file.  Social History:  reports that she has never smoked. She does not have any smokeless tobacco history on file. Her alcohol and drug histories are not on file.  Allergies: No Known Allergies  Medications reviewed.   Review of Systems:   Review of Systems  Constitutional: Negative.   HENT: Negative.   Eyes: Negative.   Respiratory: Negative.   Cardiovascular: Negative.   Gastrointestinal: Negative for heartburn, nausea, vomiting, abdominal pain, diarrhea, constipation, blood in stool and melena.  Genitourinary: Negative.   Musculoskeletal: Negative.   Skin: Negative.   Neurological: Negative.   Endo/Heme/Allergies: Negative.   Psychiatric/Behavioral: Negative.      Physical Exam:  LMP 03/17/2015  Physical Exam  Constitutional: She is oriented to person, place, and time. No distress.  Appears comfortable and wearing makeup need by multiple family members  HENT:  Head: Normocephalic and atraumatic.  Neck: Normal range of motion.  Abdominal: Soft. Bowel sounds are normal. She exhibits no distension. There is no tenderness. There is no rebound and no guarding.  Musculoskeletal: Normal range of motion. She exhibits no edema.  Lymphadenopathy:    She has no cervical adenopathy.   Neurological: She is alert and oriented to person, place, and time.  Skin: Skin is warm and dry. She is not diaphoretic. No erythema.  Vitals reviewed.     No results found for this or any previous visit (from the past 48 hour(s)). No results found.  Assessment/Plan:  Patient was treated in the hospital for a partial small bowel obstruction with she responded spontaneously however is unclear if that was sutured diagnosis she was having some fevers on 10 easily as well and while this could be gastroenteritis it is unclear to me as to the actual cause. As she has spontaneously resolved I will ask her to see a gastroenterologist and given her referral to Dr. Delanna Ahmadi, MD, FACS

## 2015-04-05 NOTE — Patient Instructions (Addendum)
There is no surgery needed for today.  We will set you up an appointment with a GI (Doctor for your stomach and intestines).  Call with any questions that you may have, we have a nurse that speaks spanish to help with any questions.

## 2015-05-12 ENCOUNTER — Ambulatory Visit: Payer: Self-pay | Admitting: Gastroenterology

## 2016-03-02 IMAGING — CT CT ABD-PELV W/ CM
2 of 4 series · 14 of 42 positions shown, 18 images · IV contrast (omnipaque)
Comparison: None.

ADDENDUM:
These results were called by telephone at the time of interpretation
on 03/19/2015 at [DATE] to Dr. ZAMIE BENJAMIN , who verbally
acknowledged these results.
CLINICAL DATA: 39-year-old female with abdominal pain.

EXAM:
CT ABDOMEN AND PELVIS WITH CONTRAST
TECHNIQUE: Multidetector CT imaging of the abdomen and pelvis was performed
using the standard protocol following bolus administration of
intravenous contrast.
CONTRAST:  100mL OMNIPAQUE IOHEXOL 300 MG/ML  SOLN

[Series 2: routine abd pel with · axial · 0.64mm/px · z∈[-466,-51]mm · 11 of 98 slices shown, 15 images]
[im 10/98  soft-tissue]
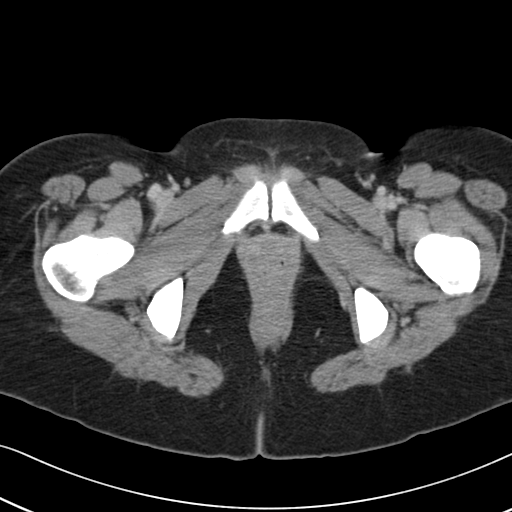
[im 10/98  bone]
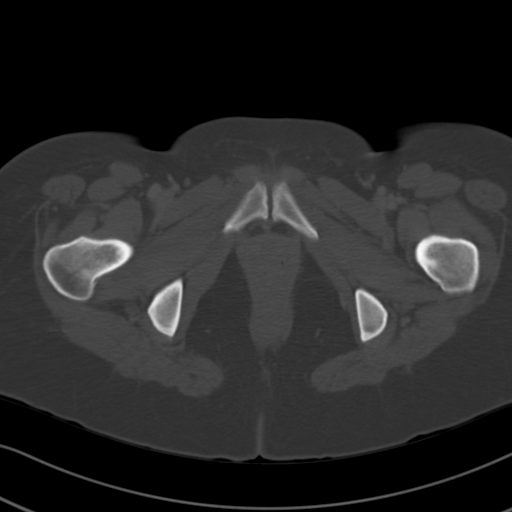
[im 20/98  soft-tissue]
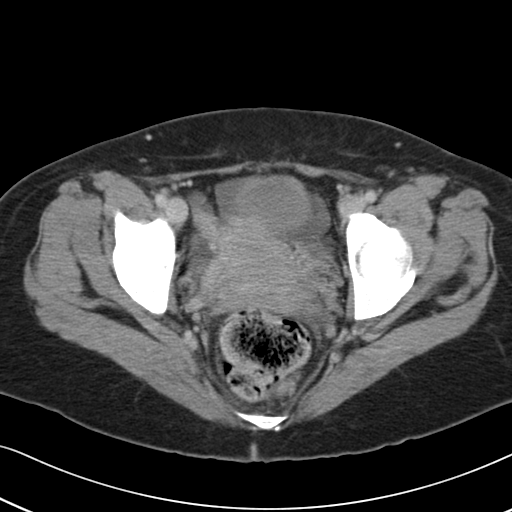
[im 30/98  soft-tissue]
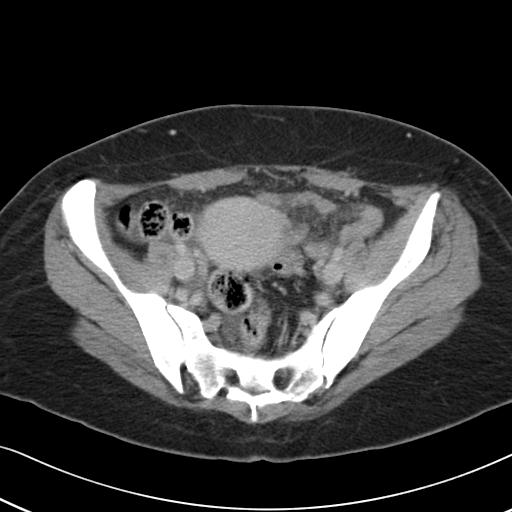
[im 39/98  soft-tissue]
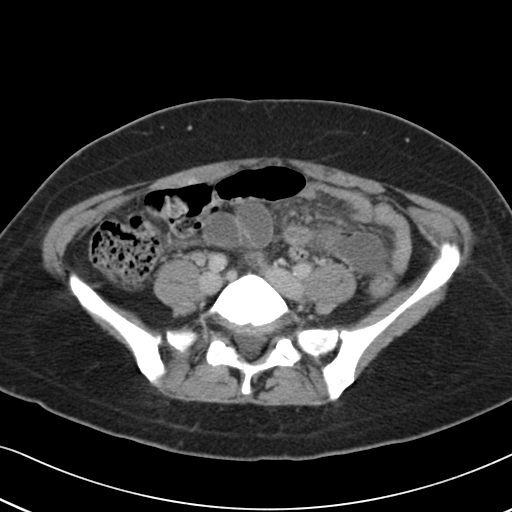
[im 49/98  soft-tissue]
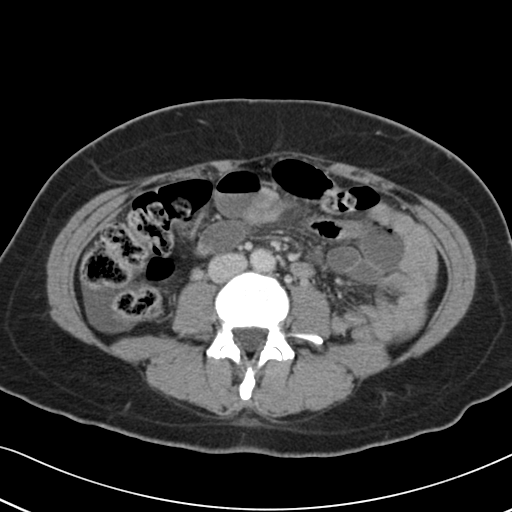
[im 59/98  soft-tissue]
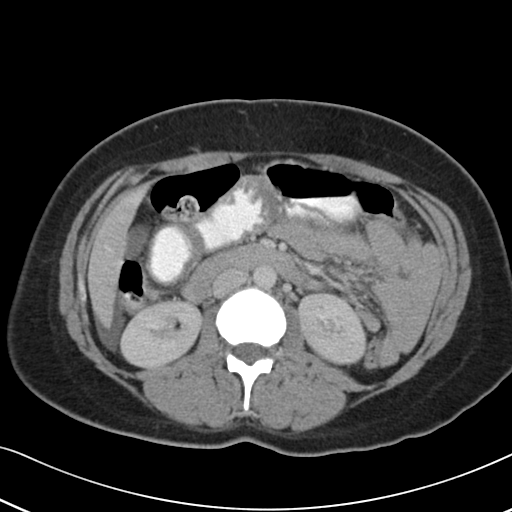
[im 68/98  soft-tissue]
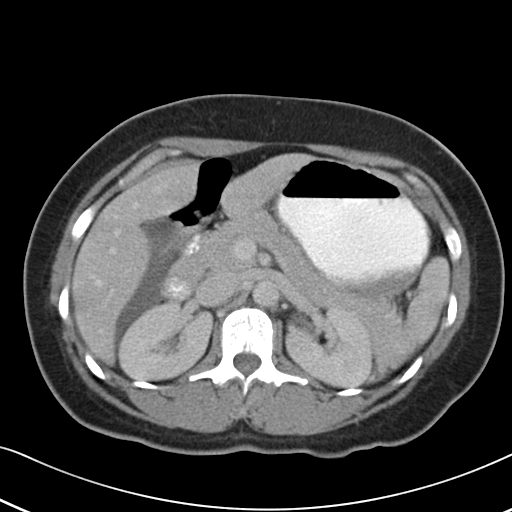
[im 78/98  soft-tissue]
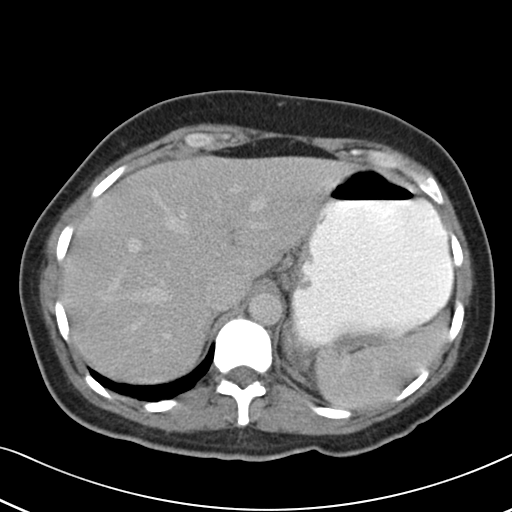
[im 78/98  lung]
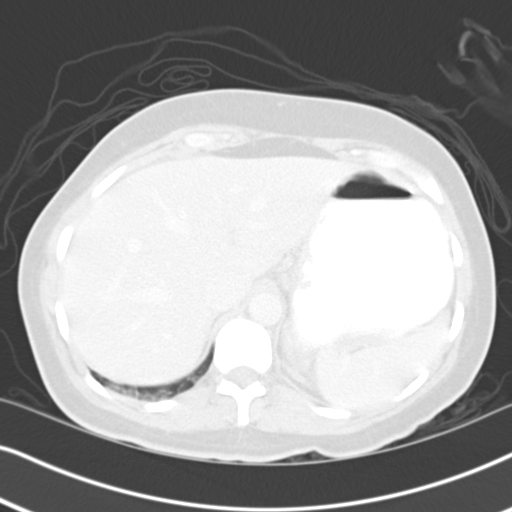
[im 83/98  lung]
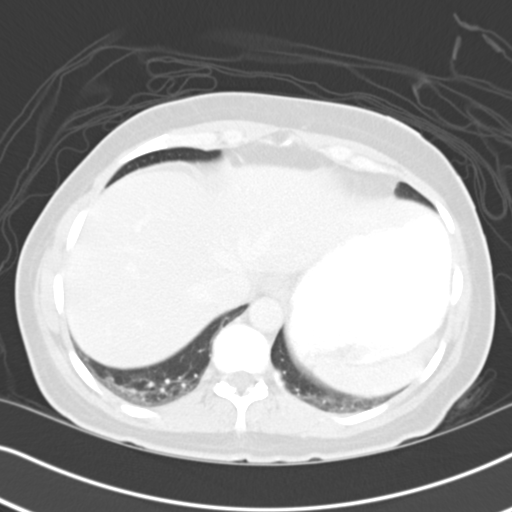
[im 88/98  soft-tissue]
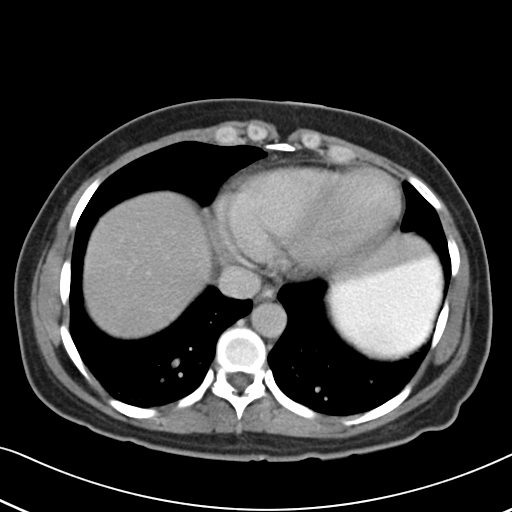
[im 88/98  lung]
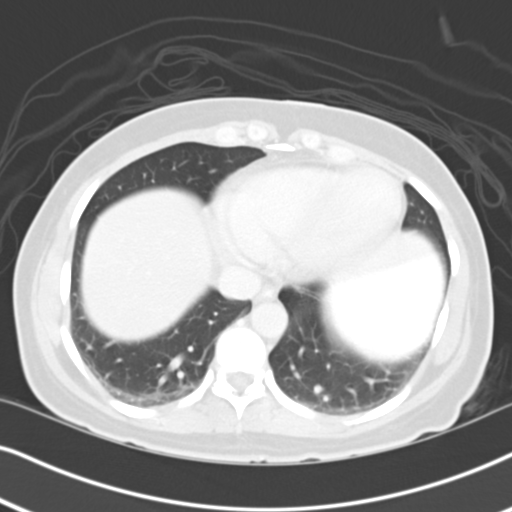
[im 88/98  bone]
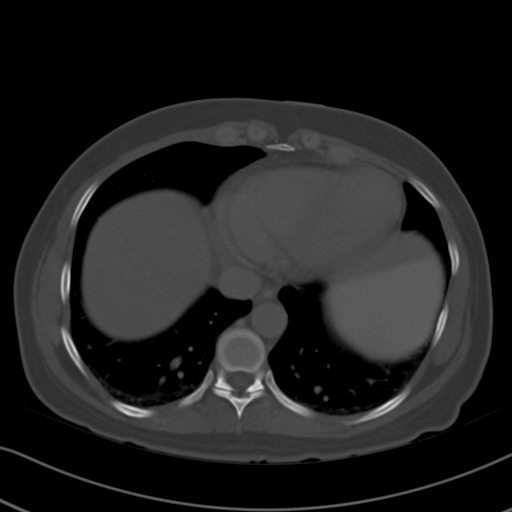
[im 93/98  lung]
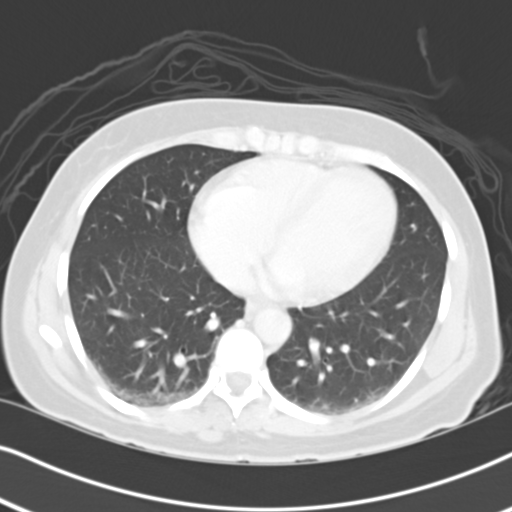

[Series 6: cor routine abd pel with · coronal · 1.03mm/px · 3 of 121 slices shown]
[im 41/121  soft-tissue]
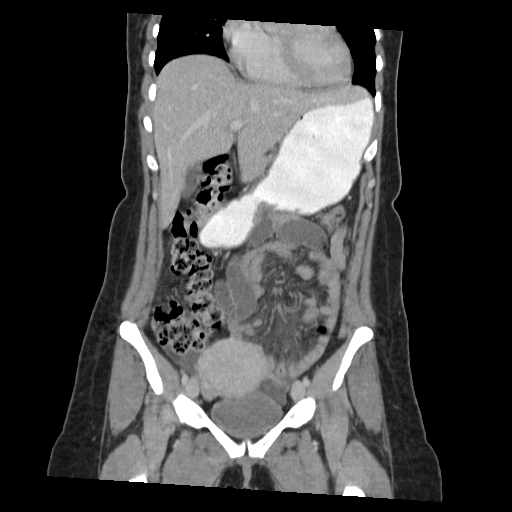
[im 54/121  soft-tissue]
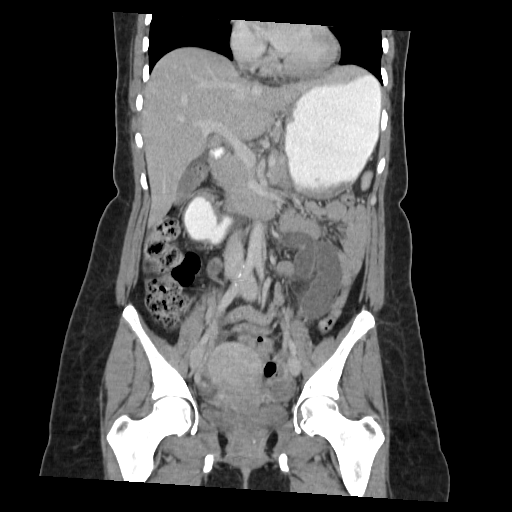
[im 67/121  soft-tissue]
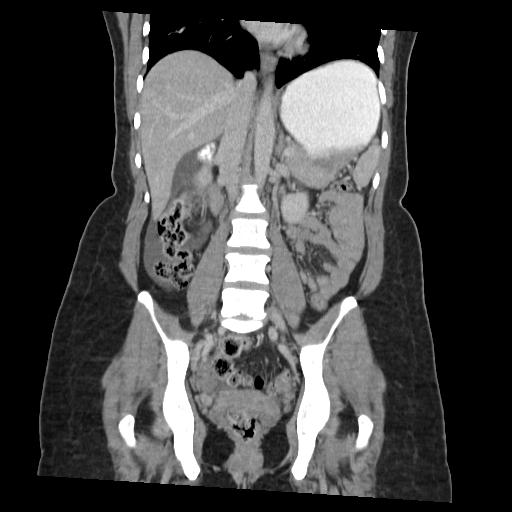

[14 of 42 positions shown; findings below may reference images not displayed]

FINDINGS: The visualized lung bases are clear. No intra-abdominal free air.
Small ascites.

The liver, pancreas, spleen, adrenal glands, kidneys, visualized
ureters appear unremarkable. There is apparent diffuse thickening of
the bladder wall which may be partly related to underdistention.
Cystitis is not excluded. Correlation with urinalysis recommended.
The uterus is anteverted and appears grossly unremarkable. The
ovaries appear unremarkable.

Oral contrast is noted within the stomach. No evidence of gastric
outlet obstruction. There is a focal area of mesentery twisting in
the left mid abdomen best seen on coronal series 6 images 38 -55.
There is stranding of the lower abdominal mesentery and pelvic fat.
There is mild prominence of fluid-filled loop of small bowel
proximal to this area. The small bowel distal to this area are
decompressed. There is an area of obstruction within the small bowel
(series 6, image 48 and series 2, image 56). Findings may be related
to a mesenteric volvulus. An internal hernia is not excluded.
Clinical correlation and surgical consult is advised. Small amount
of gas in the periphery of the lumen of this loop of small bowel
just proximal to the obstruction is likely intraluminal and less
likely pneumatosis. Constipation. The appendix is not visualized
with certainty. No inflammatory changes identified in the right
lower quadrant.

The abdominal aorta and IVC appear patent. No portal venous gas
identified. There is no adenopathy.

Small fat containing umbilical hernia. The abdominal wall soft
tissues are otherwise unremarkable. Bilateral L5 pars defects. No
acute fracture.
IMPRESSION: Small-bowel obstruction with transition zone in the left mid
hemiabdomen secondary to focal mesenteric twisting. Findings may
represent a mesentery volvulus. An internal hernia is not excluded.
Clinical correlation and surgical consult is advised.

No portal venous gas.

Small ascites.

## 2016-03-03 IMAGING — CR DG ABDOMEN 2V
1 series · 2 of 2 positions shown · non-contrast
Comparison: 03/19/2015

CLINICAL DATA: Follow-up small bowel obstruction

EXAM:
ABDOMEN - 2 VIEW

[Series 1: dg abd 2 views · 0.14mm/px · 2 of 2 slices shown]
[im 1/2]
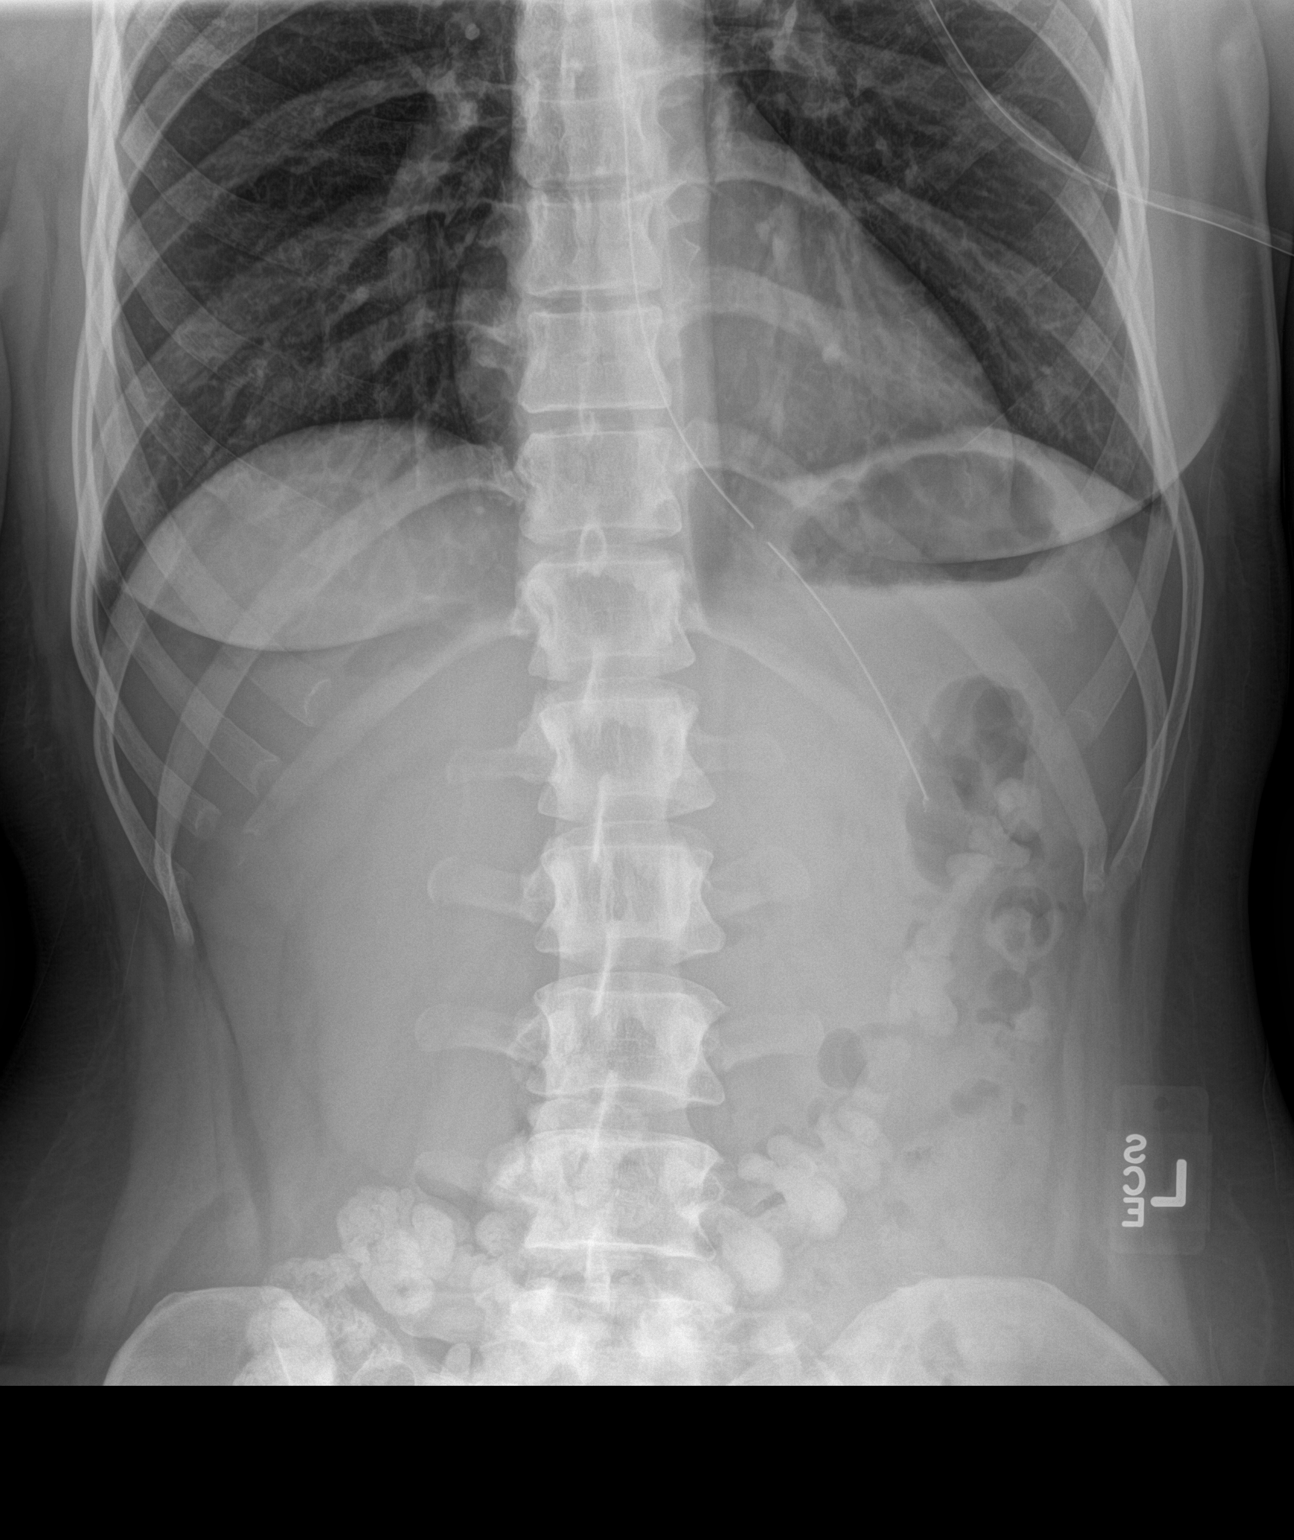
[im 2/2]
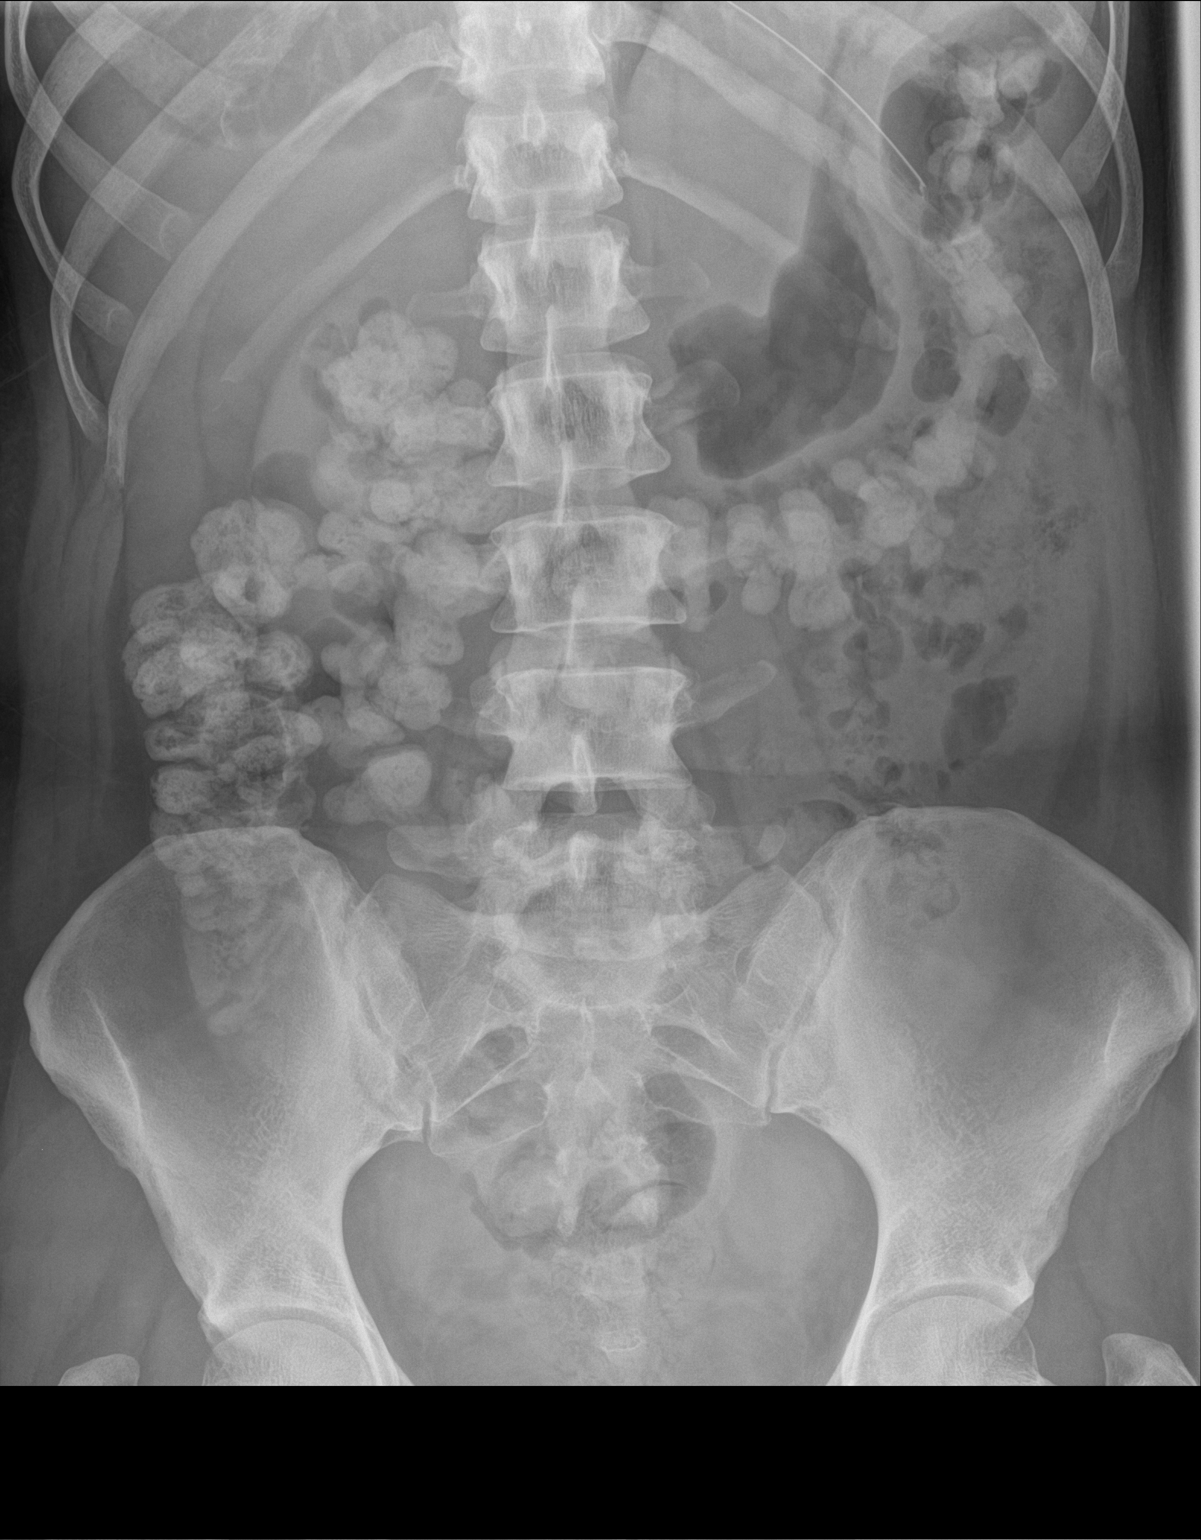

[2 of 2 positions shown; findings below may reference images not displayed]

FINDINGS: Tip of endotracheal tube projects over stomach with proximal
side-port a GE junction.

Lung bases clear.

Retained contrast in colon.

No small bowel dilatation.

Stool present in rectum.

No bowel wall thickening or free intraperitoneal air.

Stomach decompressed versus previous exam.

Bones unremarkable.
IMPRESSION: Nonobstructive bowel gas pattern now identified.

Oral contrast has progressed into the colon with stool present to
rectum.

## 2016-10-31 ENCOUNTER — Ambulatory Visit: Payer: Self-pay | Attending: Oncology

## 2016-10-31 ENCOUNTER — Ambulatory Visit
Admission: RE | Admit: 2016-10-31 | Discharge: 2016-10-31 | Disposition: A | Discharge status: Home / Self Care | Payer: Self-pay | Source: Ambulatory Visit | Attending: Oncology | Admitting: Oncology

## 2016-10-31 VITALS — BP 140/80 | HR 67 | Temp 98.2°F | Ht 64.0 in | Wt 166.0 lb

## 2016-10-31 DIAGNOSIS — Z0000 Encounter for general adult medical examination without abnormal findings: Secondary | ICD-10-CM

## 2016-10-31 NOTE — Progress Notes (Signed)
Subjective:     Patient ID: Stacey Meyer, female   DOB: August 23, 1975, 41 y.o.   MRN: [id number]  HPI   Review of Systems     Objective:   Physical Exam  Pulmonary/Chest: Right breast exhibits no inverted nipple, no mass, no nipple discharge, no skin change and no tenderness. Left breast exhibits no inverted nipple, no mass, no nipple discharge, no skin change and no tenderness. Breasts are symmetrical.       Assessment:     41 year old patient presents for [facility] clinic visit. Patient screened, and meets BCCCP eligibility.  Patient does not have insurance, Medicare or Medicaid.  Handout given on Affordable Care Act. Patient screened, and meets BCCCP eligibility.  Patient does not have insurance, Medicare or Medicaid.  Handout given on Affordable Care Act.  CBE unremarkable.  No mass or lump palpated.     Plan:     Sent for bilateral screening mammogram.

## 2016-11-01 NOTE — Progress Notes (Signed)
Letter mailed from [facility] to notify of normal mammogram results.  Patient to return in one year for annual screening.  Copy to [facility]. 

## 2019-09-01 ENCOUNTER — Other Ambulatory Visit: Payer: Self-pay

## 2019-09-01 ENCOUNTER — Ambulatory Visit (INDEPENDENT_AMBULATORY_CARE_PROVIDER_SITE_OTHER): Payer: Self-pay | Admitting: Dermatology

## 2019-09-01 DIAGNOSIS — L811 Chloasma: Secondary | ICD-10-CM

## 2019-09-01 DIAGNOSIS — L80 Vitiligo: Secondary | ICD-10-CM

## 2019-09-01 MED ORDER — TACROLIMUS 0.1 % EX OINT: TOPICAL_OINTMENT | CUTANEOUS | 2 refills | Status: DC

## 2019-09-01 MED ORDER — CLOBETASOL PROPIONATE 0.05 % EX CREA: TOPICAL_CREAM | CUTANEOUS | 2 refills | Status: AC

## 2019-09-01 NOTE — Patient Instructions (Addendum)
Instructions for [facility] Medications  One or more of your medications was sent to the [facility] mail order compounding pharmacy. You will receive an email from them and can purchase the medicine through that link. It will then be mailed to your home at the address you confirmed. If for any reason you do not receive an email from them, please check your spam folder. If you still do not find the email, please let us know.   Recommend taking Heliocare (or Heliocare Advanced) sun protection supplement daily in sunny weather for additional sun protection. For maximum protection on the sunniest days, you can take up to 2 capsules of regular Heliocare OR take 1 capsule of Heliocare Ultra. For prolonged exposure (such as a full day in the sun), you can repeat your dose of the supplement 4 hours after your first dose. Heliocare can be purchased at [facility] or at [email or web address].   Start [facility] Hydroquinone 8%, Tretinoin 0.1%, Kojic Acid 1%, Niacinamide 4%, Fluocinolone 0.025% cream pea sized amount once nightly to the aas face for up to 2 months. This cannot be used more than 2 months due to risk of exogenous ochronosis (permanent dark spots). The patient was advised this is not covered by insurance. They will receive an email to check out and the medication will be mailed to their home.   Topical steroids (such as triamcinolone, fluocinolone, fluocinonide, mometasone, clobetasol, halobetasol, betamethasone, hydrocortisone) can cause thinning and lightening of the skin if they are used for too long in the same area. Your physician has selected the right strength medicine for your problem and area affected on the body. Please use your medication only as directed by your physician to prevent side effects.   Your medications have been sent to [facility] and will be mailed to you after you call them and confirm your information with them.   [facility] [phone] [address] [place], [place], [place] [address]

## 2019-09-01 NOTE — Progress Notes (Signed)
° °  Follow-Up Visit   Subjective  Stacey Meyer is a 44 y.o. female who presents for the following: vitiligo (eyelids, neck, lower leg. Ran out of Protopic 0.1% ointment and clobetasol cream.). eyelids and neck have improved with Protopic use, but hasn't used it in a year or so.  She also has dark spots on her bilateral cheeks.  Worse in the summer.  She does not take BCPs.   The following portions of the chart were reviewed this encounter and updated as appropriate:      Review of Systems:  No other skin or systemic complaints except as noted in HPI or Assessment and Plan.  Objective  Well appearing patient in no apparent distress; mood and affect are within normal limits.  A focused examination was performed including face, legs, neck. Relevant physical exam findings are noted in the Assessment and Plan.  Objective  eyelids, elbows, neck, ankles: Depigmented and hypopigmented patches upper and lower eyelids bil; depigmented patches on elbows bil, ankles bil; hypopigmented speckled patches on ant neck; large depigmented patch on post neck.  Images                        Objective  bil zygoma: Reticulated hyperpigmentation.  Images       Assessment & Plan  Vitiligo eyelids, elbows, neck, ankles  Restart Protopic 0.1% Ointment Apply qd/bid AAs face, anterior neck dsp 60g 2Rf. Restart clobetasol cream Apply QD to affected areas ankles, elbows, posterior neck. Avoid face, groin, axilla. Dsp 60g 2Rf. Continue daily alpha-lipoic acid  Topical steroids (such as triamcinolone, fluocinolone, fluocinonide, mometasone, clobetasol, halobetasol, betamethasone, hydrocortisone) can cause thinning and lightening of the skin if they are used for too long in the same area. Your physician has selected the right strength medicine for your problem and area affected on the body. Please use your medication only as directed by your physician to prevent side effects.       tacrolimus (PROTOPIC) 0.1 % ointment - eyelids, elbows, neck, ankles  clobetasol cream (TEMOVATE) 0.05 % - eyelids, elbows, neck, ankles  Melasma bil zygoma  Discussed chronic condition, worse in summer due to higher UV exposure.  Oral estrogen containing BCPs or supplements can exacerbate condition.  Recommend daily broad spectrum tinted sunscreen SPF 30+ to face, preferably with Zinc or Titanium Dioxide. Discussed Rx topical bleaching creams (i.e. hydroquinone), OTC HelioCare supplement, chemical peels (would need multiple for best result).    Start Hydroquinone 8%, Tretinoin 0.1%, Kojic Acid 1%, Niacinamide 4%, Fluocinolone 0.025% Apply to face nightly x 2 months Size 30g 0Rf. Sent to [facility].  Elta MD Clear 46 Tinted, Elta MD Replenish, Elta MD Elements, La Roche-Posay mineral tinted- sample given.  Recommend taking Heliocare sun protection supplement daily in sunny weather for additional sun protection. For maximum protection on the sunniest days, you can take up to 2 capsules of regular Heliocare OR take 1 capsule of Heliocare Ultra. For prolonged exposure (such as a full day in the sun), you can repeat your dose of the supplement 4 hours after your first dose. Heliocare can be purchased at [facility] or at [email or web address].    Return in about 2 months (around 11/01/2019) for melasma, vitiligo.   Documentation: I have reviewed the above documentation for accuracy and completeness, and I agree with the above.  Willeen Niece MD

## 2019-11-10 ENCOUNTER — Ambulatory Visit (INDEPENDENT_AMBULATORY_CARE_PROVIDER_SITE_OTHER): Payer: Self-pay | Admitting: Dermatology

## 2019-11-10 ENCOUNTER — Other Ambulatory Visit: Payer: Self-pay

## 2019-11-10 DIAGNOSIS — L80 Vitiligo: Secondary | ICD-10-CM

## 2019-11-10 DIAGNOSIS — L811 Chloasma: Secondary | ICD-10-CM

## 2019-11-10 NOTE — Progress Notes (Signed)
   Follow-Up Visit   Subjective  Stacey Meyer is a 44 y.o. female who presents for the following: Vitiligo (patient currently using Protopic on the face and ant neck and Clobetasol to the arms and legs - tolerating medications well and has seen an improvement) and melasma (of the B/L zygoma - improved with [facility] mix).  The following portions of the chart were reviewed this encounter and updated as appropriate:     Review of Systems:  No other skin or systemic complaints except as noted in HPI or Assessment and Plan.  Objective  Well appearing patient in no apparent distress; mood and affect are within normal limits.  A focused examination was performed including the face, legs, neck, and elbows. Relevant physical exam findings are noted in the Assessment and Plan.  Objective  B/L zygoma: Reticulated hyperpigmented patches. Photos compared  Objective  Face, neck, elbows, ankles: Hypopigmented macules and patches.  Mild improvement noted (less white) when photos compared   Assessment & Plan  Melasma B/L zygoma  Slight improvement with [facility] mix (tret/fluo/hydr) - Finish prescription (QHS) then start OTC [employer] Inkey Tranexamic Acid Hyperpigmentation Treatment qhs throughout the winter months. Plan to send in refill of [facility] mix next spring. Continue Elta MD Clear tinted spf 46 sunscreen to face daily and Heliocare vitamin supplement daily when outdoors.  Can take discontinue Heliocare in wintertime  Vitiligo Face, neck, elbows, ankles  Improving with current treatment - Continue Clobetasol 0.05% cream qd to aa's post neck, elbows, and ankles M-F (do not use on the weekends). Patient to substitute Protopic on the weekends. Continue Protopic to aa's face and anterior neck BID.  Other Related Medications tacrolimus (PROTOPIC) 0.1 % ointment clobetasol cream (TEMOVATE) 0.05 %  Return in about 4 months (around 03/11/2020).  Maylene Roes, CMA, am  acting as scribe for Willeen Niece, MD .  Documentation: I have reviewed the above documentation for accuracy and completeness, and I agree with the above.  Willeen Niece MD

## 2020-04-12 ENCOUNTER — Ambulatory Visit (INDEPENDENT_AMBULATORY_CARE_PROVIDER_SITE_OTHER): Payer: Self-pay | Admitting: Dermatology

## 2020-04-12 ENCOUNTER — Other Ambulatory Visit: Payer: Self-pay

## 2020-04-12 DIAGNOSIS — L80 Vitiligo: Secondary | ICD-10-CM

## 2020-04-12 DIAGNOSIS — L811 Chloasma: Secondary | ICD-10-CM

## 2020-04-12 NOTE — Patient Instructions (Addendum)
Topical steroids (such as triamcinolone, fluocinolone, fluocinonide, mometasone, clobetasol, halobetasol, betamethasone, hydrocortisone) can cause thinning and lightening of the skin if they are used for too long in the same area. Your physician has selected the right strength medicine for your problem and area affected on the body. Please use your medication only as directed by your physician to prevent side effects.   Instructions for [facility] Medications  One or more of your medications was sent to the [facility] mail order compounding pharmacy. You will receive an email from them and can purchase the medicine through that link. It will then be mailed to your home at the address you confirmed. If for any reason you do not receive an email from them, please check your spam folder. If you still do not find the email, please let us know. [facility] phone number is [phone].  [facility] Hydroquinone 12%/kojic acid/vitamin C cream pea sized amount twice daily to the entire face for up to 3 months. This cannot be used more than 3 months due to risk of exogenous ochronosis (permanent dark spots).

## 2020-04-12 NOTE — Progress Notes (Signed)
   Follow-Up Visit   Subjective  Stacey Meyer is a 45 y.o. female who presents for the following: Follow-up (Patient here today for 4 month follow up on melasma and vitilgo. Patient is currently using tacrolimus 0.1 % ointment and clobetasol cream 0.05 % for melasma. For Vitiligo on face, neck, elbow, and ankles patient is using otc sephora inkey tranexamic acid hyperpigmentation treatment. ).  It doesn't help.  She has used the [facility]/Tret/Fluo. which didn't help either.  She also uses Heliocare and Elta MD tinted sunscreen.    The following portions of the chart were reviewed this encounter and updated as appropriate:        Objective  Well appearing patient in no apparent distress; mood and affect are within normal limits.  A focused examination was performed including face, neck, bilateral arms . Relevant physical exam findings are noted in the Assessment and Plan.  Objective  Head - Anterior (Face): Hypopigmented patches, increased pigmentation anterior neck.  Photos compared.  Objective  Head - Anterior (Face): Reticulated hyperpigmented patches. Photos compared, minimal lightening  Assessment & Plan  Vitiligo Head - Anterior (Face)  Improving with current treatment, face/neck   Continue Clobetasol 0.05% cream qd to aa's post neck, elbows, and ankles M-F (do not use on the weekends). Patient to substitute Protopic on the weekends. Continue Protopic to aa's face and anterior neck BID.   Other Related Medications tacrolimus (PROTOPIC) 0.1 % ointment clobetasol cream (TEMOVATE) 0.05 %  Discussed Opzelura cream to try when approved  Patient has home UVB phototherapy wand,  Advised start low setting and slowly increase settings 2-3x/week, don't use on face   Other Related Medications tacrolimus (PROTOPIC) 0.1 % ointment clobetasol cream (TEMOVATE) 0.05 %  Melasma Head - Anterior (Face)  Continue Elta MD Clear tinted spf 46 sunscreen to face daily Continue Heliocare  vitamin supplement 2 daily bid when outdoors during spring/summer.   Will prescribe [facility] Hydroquinone 12%/kojic acid/vitamin C cream pea sized amount twice daily to the entire face for up to 3 months. This cannot be used more than 3 months due to risk of exogenous ochronosis (permanent dark spots). The patient was advised this is not covered by insurance since it is made by a compounding pharmacy. They will receive an email to check out and the medication will be mailed to their home.     Return in about 3 months (around 07/10/2020) for melasma, vitiligo.  I, Asher Muir, CMA, am acting as scribe for Willeen Niece, MD.  Documentation: I have reviewed the above documentation for accuracy and completeness, and I agree with the above.  Willeen Niece MD

## 2020-07-18 ENCOUNTER — Ambulatory Visit (INDEPENDENT_AMBULATORY_CARE_PROVIDER_SITE_OTHER): Payer: Self-pay | Admitting: Dermatology

## 2020-07-18 ENCOUNTER — Other Ambulatory Visit: Payer: Self-pay

## 2020-07-18 DIAGNOSIS — L811 Chloasma: Secondary | ICD-10-CM

## 2020-07-18 DIAGNOSIS — L80 Vitiligo: Secondary | ICD-10-CM

## 2020-07-18 MED ORDER — TACROLIMUS 0.1 % EX OINT
TOPICAL_OINTMENT | CUTANEOUS | 2 refills | Status: AC
Start: 2020-07-18 — End: ?

## 2020-07-18 NOTE — Progress Notes (Signed)
   Follow-Up Visit   Subjective  Stacey Meyer is a 45 y.o. female who presents for the following: Follow-up (Patient here for 3 month follow-up melasma and vitiligo of the face. She is improved using [facility] cream for melasma as well as Heliocare and Elta MD sunscreen. She has tried [employer] Tranexamic Acid night treatment/In the past with no improvement. Vitiligo is improved with clobetasol cream and Protopic ointment. She is no longer using phototherapy wand at home as it made her skin too pink.).   The following portions of the chart were reviewed this encounter and updated as appropriate:       Review of Systems:  No other skin or systemic complaints except as noted in HPI or Assessment and Plan.  Objective  Well appearing patient in no apparent distress; mood and affect are within normal limits.  A focused examination was performed including face, arms, legs, neck. Relevant physical exam findings are noted in the Assessment and Plan.  Objective  face, neck, elbows, ankles: Mild hypopigmentation on bil medial canthus, bil elbows, neck, ankles, patches are smaller with evidence of repigmentation. Improved when compared to baseline photos. No skin atrophy noted  Objective  face: Mild reticulated hyperpigmentation BL zygoma, fading noted when compared to baseline photos   Assessment & Plan  Vitiligo face, neck, elbows, ankles  Reviewed chronic nature, no cure and can be difficult to treat.  Vitiligo is an autoimmune condition which causes loss of skin pigment and is commonly seen on the face and may also involve areas of trauma like hands, elbows, knees, and ankles.  Treatments include topical steroids and other topical anti-inflammatory ointments/creams.  Sometimes narrow band UV light therapy or Xtrac laser is helpful, both of which require twice weekly treatments for at least 3-6 months.  Antioxidant vitamins, such as Vitamins C&E, and alpha lipoic acid may be  added to enhance treatment.  Improving compared to previous photos, especially face and neck  Continue tacrolimus ointment qd/bid Aas face, neck.  Continue clobetasol cream Apply to Aas elbows, ankles qd/bid. Avoid face, groin, axilla. Caution atrophy with long-term use.   Topical steroids (such as triamcinolone, fluocinolone, fluocinonide, mometasone, clobetasol, halobetasol, betamethasone, hydrocortisone) can cause thinning and lightening of the skin if they are used for too long in the same area. Your physician has selected the right strength medicine for your problem and area affected on the body. Please use your medication only as directed by your physician to prevent side effects.    Reordered Medications tacrolimus (PROTOPIC) 0.1 % ointment  Other Related Medications clobetasol cream (TEMOVATE) 0.05 %  Melasma face  Melasma is a condition of persistent pigmented patches generally on the face, worse in summer due to higher UV exposure.  Oral estrogen containing BCPs or supplements can exacerbate condition.  Recommend daily broad spectrum tinted sunscreen SPF 30+ to face, preferably with Zinc or Titanium Dioxide. Discussed Rx topical bleaching creams (i.e. hydroquinone), OTC HelioCare supplement, chemical peels (would need multiple for best result).   Improving. Decrease [facility] Hydroquinone 12%/kojic acid/vitamin C cream to qhs. Patient will take a break during the fall/winter. Continue Elta MD 46 Clear qam. Continue Heliocare when outdoors.   Return in about 6 months (around 01/18/2021) for melasma, vitiligo.   ICherlyn Labella, CMA, am acting as scribe for Willeen Niece, MD .  Documentation: I have reviewed the above documentation for accuracy and completeness, and I agree with the above.  Willeen Niece MD

## 2020-07-18 NOTE — Patient Instructions (Addendum)
Instructions for [facility] Medications  One or more of your medications was sent to the [facility] mail order compounding pharmacy. You will receive an email from them and can purchase the medicine through that link. It will then be mailed to your home at the address you confirmed. If for any reason you do not receive an email from them, please check your spam folder. If you still do not find the email, please let us know. [facility] phone number is [phone].  Your medications have been sent to [facility] and will be mailed to you after you call them and confirm your information with them.   [facility] [phone] [address] [place], [place], [place] [address]  If you have any questions or concerns for your doctor, please call our main line at [phone] and press option 4 to reach your doctor's medical assistant. If no one answers, please leave a voicemail as directed and we will return your call as soon as possible. Messages left after 4 pm will be answered the following business day.   You may also send [place] a message via MyChart. We typically respond to MyChart messages within 1-2 business days.  For prescription refills, please ask your pharmacy to contact our office. Our fax number is [phone].  If you have an urgent issue when the clinic is closed that cannot wait until the next business day, you can page your doctor at the number below.    Please note that while we do our best to be available for urgent issues outside of office hours, we are not available 24/7.   If you have an urgent issue and are unable to reach [place], you may choose to seek medical care at your doctor's office, retail clinic, urgent care center, or emergency room.  If you have a medical emergency, please immediately call 911 or go to the emergency department.  Pager Numbers  - Dr. Gwen Pounds: [phone]  - Dr. Neale Burly: [phone]  - Dr. Roseanne Reno: [phone]  In the event of  inclement weather, please call our main line at [phone] for an update on the status of any delays or closures.  Dermatology Medication Tips: Please keep the boxes that topical medications come in in order to help keep track of the instructions about where and how to use these. Pharmacies typically print the medication instructions only on the boxes and not directly on the medication tubes.   Melasma is a condition of persistent pigmented patches generally on the face, worse in summer due to higher UV exposure.  Oral estrogen containing BCPs or supplements can exacerbate condition.  Recommend daily broad spectrum tinted sunscreen SPF 30+ to face, preferably with Zinc or Titanium Dioxide. Discussed Rx topical bleaching creams (i.e. hydroquinone), OTC HelioCare supplement, chemical peels (would need multiple for best result).   Reviewed chronic nature, no cure and can be difficult to treat.  Vitiligo is an autoimmune condition which causes loss of skin pigment and is commonly seen on the face and may also involve areas of trauma like hands, elbows, knees, and ankles.  Treatments include topical steroids and other topical anti-inflammatory ointments/creams.  Sometimes narrow band UV light therapy or Xtrac laser is helpful, both of which require twice weekly treatments for at least 3-6 months.  Antioxidant vitamins, such as Vitamins C&E, and alpha lipoic acid may be added to enhance treatment.   If your medication is too expensive, please contact our office at [phone] option 4 or send [place] a message  through MyChart.   We are unable to tell what your co-pay for medications will be in advance as this is different depending on your insurance coverage. However, we may be able to find a substitute medication at lower cost or fill out paperwork to get insurance to cover a needed medication.   If a prior authorization is required to get your medication covered by your insurance company, please allow [place] 1-2  business days to complete this process.  Drug prices often vary depending on where the prescription is filled and some pharmacies may offer cheaper prices.  The website www.goodrx.com contains coupons for medications through different pharmacies. The prices here do not account for what the cost may be with help from insurance (it may be cheaper with your insurance), but the website can give you the price if you did not use any insurance.  - You can print the associated coupon and take it with your prescription to the pharmacy.  - You may also stop by our office during regular business hours and pick up a GoodRx coupon card.  - If you need your prescription sent electronically to a different pharmacy, notify our office through [facility] or by phone at [phone] option 4.

## 2021-01-24 ENCOUNTER — Other Ambulatory Visit: Payer: Self-pay

## 2021-01-24 ENCOUNTER — Ambulatory Visit (INDEPENDENT_AMBULATORY_CARE_PROVIDER_SITE_OTHER): Payer: Self-pay | Admitting: Dermatology

## 2021-01-24 DIAGNOSIS — L80 Vitiligo: Secondary | ICD-10-CM

## 2021-01-24 DIAGNOSIS — L811 Chloasma: Secondary | ICD-10-CM

## 2021-01-24 MED ORDER — AZELAIC ACID 15 % EX GEL: CUTANEOUS | 3 refills | Status: AC

## 2021-01-24 MED ORDER — TRETINOIN 0.025 % EX CREA: TOPICAL_CREAM | CUTANEOUS | 3 refills | Status: AC

## 2021-01-24 NOTE — Patient Instructions (Addendum)
Start Tretinoin 0.025% cream apply a pea sized amount to the face every night. Topical retinoid medications like tretinoin/Retin-A, adapalene/Differin, tazarotene/Fabior, and Epiduo/Epiduo Forte can cause dryness and irritation when first started. Only apply a pea-sized amount to the entire affected area. Avoid applying it around the eyes, edges of mouth and creases at the nose. If you experience irritation, use a good moisturizer first and/or apply the medicine less often. If you are doing well with the medicine, you can increase how often you use it until you are applying every night. Be careful with sun protection while using this medication as it can make you sensitive to the sun. This medicine should not be used by pregnant women.   Start Azelaic acid 15% gel apply a thin coat to the face in the morning and at night.     If You Need Anything After Your Visit  If you have any questions or concerns for your doctor, please call our main line at [phone] and press option 4 to reach your doctor's medical assistant. If no one answers, please leave a voicemail as directed and we will return your call as soon as possible. Messages left after 4 pm will be answered the following business day.   You may also send [place] a message via MyChart. We typically respond to MyChart messages within 1-2 business days.  For prescription refills, please ask your pharmacy to contact our office. Our fax number is [phone].  If you have an urgent issue when the clinic is closed that cannot wait until the next business day, you can page your doctor at the number below.    Please note that while we do our best to be available for urgent issues outside of office hours, we are not available 24/7.   If you have an urgent issue and are unable to reach [place], you may choose to seek medical care at your doctor's office, retail clinic, urgent care center, or emergency room.  If you have a medical emergency, please  immediately call 911 or go to the emergency department.  Pager Numbers  - Dr. Gwen Pounds: [phone]  - Dr. Neale Burly: [phone]  - Dr. Roseanne Reno: [phone]  In the event of inclement weather, please call our main line at [phone] for an update on the status of any delays or closures.  Dermatology Medication Tips: Please keep the boxes that topical medications come in in order to help keep track of the instructions about where and how to use these. Pharmacies typically print the medication instructions only on the boxes and not directly on the medication tubes.   If your medication is too expensive, please contact our office at [phone] option 4 or send [place] a message through MyChart.   We are unable to tell what your co-pay for medications will be in advance as this is different depending on your insurance coverage. However, we may be able to find a substitute medication at lower cost or fill out paperwork to get insurance to cover a needed medication.   If a prior authorization is required to get your medication covered by your insurance company, please allow [place] 1-2 business days to complete this process.  Drug prices often vary depending on where the prescription is filled and some pharmacies may offer cheaper prices.  The website www.goodrx.com contains coupons for medications through different pharmacies. The prices here do not account for what the cost may be with help from insurance (it may be cheaper with your insurance), but the website  can give you the price if you did not use any insurance.  - You can print the associated coupon and take it with your prescription to the pharmacy.  - You may also stop by our office during regular business hours and pick up a GoodRx coupon card.  - If you need your prescription sent electronically to a different pharmacy, notify our office through [facility] or by phone at [phone] option 4.     Si Usted Necesita Algo Despus  de Su Visita  Tambin puede enviarnos un mensaje a travs de Clinical cytogeneticist. Por lo general respondemos a los mensajes de MyChart en el transcurso de 1 a 2 das hbiles.  Para renovar recetas, por favor pida a su farmacia que se ponga en contacto con nuestra oficina. Annie Sable de fax es [place] [phone].  Si tiene un asunto urgente cuando la clnica est cerrada y que no puede esperar hasta el siguiente da hbil, puede llamar/localizar a su doctor(a) al nmero que aparece a continuacin.   Por favor, tenga en cuenta que aunque hacemos todo lo posible para estar disponibles para asuntos urgentes fuera del horario de [place], no estamos disponibles las 24 horas del da, los 7 [address]  [address] de la [place].   Si tiene un problema urgente y no puede comunicarse con nosotros, puede optar por buscar atencin mdica  en el consultorio de su doctor(a), en una clnica privada, en un centro de atencin urgente o en una sala de emergencias.  Si tiene Engineer, drilling, por favor llame inmediatamente al 911 o vaya a la sala de emergencias.  Nmeros de bper  - Dr. Gwen Pounds: [phone]  - Dra. Moye: [phone]  - Dra. Roseanne Reno: [phone]  En caso de inclemencias del [place], por favor llame a Lacy Duverney principal al [phone] para una actualizacin sobre el [place] de cualquier retraso o cierre.  Consejos para la medicacin en dermatologa: Por favor, guarde las cajas en las que vienen los medicamentos de uso tpico para ayudarle a seguir las instrucciones sobre dnde y cmo usarlos. Las farmacias generalmente imprimen las instrucciones del medicamento slo en las cajas y no directamente en los tubos del [place].   Si su medicamento es muy caro, por favor, pngase en contacto con Rolm Gala llamando al [phone] y presione la opcin 4 o envenos un mensaje a travs de Clinical cytogeneticist.   No podemos decirle cul ser su copago por los medicamentos por adelantado ya que esto es diferente dependiendo  de la cobertura de su seguro. Sin embargo, es posible que podamos encontrar un medicamento sustituto a Audiological scientist un formulario para que el seguro cubra el medicamento que se considera necesario.   Si se requiere una autorizacin previa para que su compaa de seguros [place] su medicamento, por favor permtanos de 1 a 2 das hbiles para completar [address]  Los precios de los medicamentos varan con frecuencia dependiendo del Environmental consultant de dnde se surte la receta y alguna farmacias pueden ofrecer precios ms baratos.  El sitio web www.goodrx.com tiene cupones para medicamentos de Health and safety inspector. Los precios aqu no tienen en cuenta lo que podra costar con la ayuda del seguro (puede ser ms barato con su seguro), pero el sitio web puede darle el precio si no utiliz Tourist information centre manager.  - Puede imprimir el cupn correspondiente y llevarlo con su receta a la farmacia.  - Tambin puede pasar por nuestra oficina durante el horario de atencin regular y Education officer, museum una tarjeta de cupones de GoodRx.  -  Si necesita que su receta se enve electrnicamente a una farmacia diferente, informe a nuestra oficina a travs de MyChart de [place] o por telfono llamando al [phone] y presione la opcin 4.  

## 2021-01-24 NOTE — Progress Notes (Signed)
Follow-Up Visit   Subjective  Stacey Meyer is a 45 y.o. female who presents for the following: vitiligo (Of the face, neck, elbows, and ankles - patient currently using Clobetasol and Tacrolimus ) and melasma (Of the face - patient used Skin Medicinals Hydroquinone mix during the summer and is taking a break from the medication over the fall and winter months.). Melasma has darkened.  Inkey TXA cream not working.   The following portions of the chart were reviewed this encounter and updated as appropriate:       Review of Systems:  No other skin or systemic complaints except as noted in HPI or Assessment and Plan.  Objective  Well appearing patient in no apparent distress; mood and affect are within normal limits.  A focused examination was performed including the face, elbows, neck, and ankles. Relevant physical exam findings are noted in the Assessment and Plan.  Face, neck, elbows, and ankles Depigmented patches, with repigmentation on anterior and posterior neck  Face Hyperpigmented patch on the B/L zygoma and infra ocular. Darkened when compared to photos          Assessment & Plan  Vitiligo Face, neck, elbows, and ankles  Improvement of vitiligo on the post and anterior neck - rest of sites stable  Continue Clobetasol cream to aa's elbows, neck, and ankles QD PRN.  Topical steroids (such as triamcinolone, fluocinolone, fluocinonide, mometasone, clobetasol, halobetasol, betamethasone, hydrocortisone) can cause thinning and lightening of the skin if they are used for too long in the same area. Your physician has selected the right strength medicine for your problem and area affected on the body. Please use your medication only as directed by your physician to prevent side effects.   Continue Tacrolimus 0.1% ointment to aa's QD/BID.  Continue antioxidant vitamins  Samples given of Opzelura cream to use bid around the eyes. Pt is self pay, and can't afford  Rx  Related Medications clobetasol cream (TEMOVATE) 0.05 % Apply once a day to light spots on elbows, ankles, and back of neck. Avoid face, groin, underarms.  tacrolimus (PROTOPIC) 0.1 % ointment Apply 1-2 times daily to light spots on face and neck.  Melasma Face  Worsening since stopping Skin [employer]inals Hydroquinone mix - Inkey TXA cream did not work  Melasma is a chronic condition of persistent pigmented patches generally on the face, worse in summer due to higher UV exposure.  Oral estrogen containing BCPs or supplements can exacerbate condition.  Recommend daily broad spectrum tinted sunscreen SPF 30+ to face, preferably with Zinc or Titanium Dioxide. Discussed Rx topical bleaching creams (i.e. hydroquinone), OTC HelioCare supplement, chemical peels (would need multiple for best result).   Start Azelaic acid gel BID.   Start Tretinoin 0.025% cream QHS as tolerated. 20g 3RF.   Continue Elta MD sunscreen  Will restart SM HQ cream in spring after break during winter.  Topical retinoid medications like tretinoin/Retin-A, adapalene/Differin, tazarotene/Fabior, and Epiduo/Epiduo Forte can cause dryness and irritation when first started. Only apply a pea-sized amount to the entire affected area. Avoid applying it around the eyes, edges of mouth and creases at the nose. If you experience irritation, use a good moisturizer first and/or apply the medicine less often. If you are doing well with the medicine, you can increase how often you use it until you are applying every night. Be careful with sun protection while using this medication as it can make you sensitive to the sun. This medicine should not be used by pregnant women.  Azelaic Acid 15 % gel - Face After skin is thoroughly washed and patted dry, gently but thoroughly massage a thin film of azelaic acid gel into the affected area twice daily, in the morning and evening.  tretinoin (RETIN-A) 0.025 % cream - Face Apply a pea  sized amount to the entire face QHS.  Return in about 3 months (around 04/26/2021) for melasma and vitiligo .  Maylene Roes, CMA, am acting as scribe for Willeen Niece, MD .  Documentation: I have reviewed the above documentation for accuracy and completeness, and I agree with the above.  Willeen Niece MD

## 2021-05-01 ENCOUNTER — Ambulatory Visit: Payer: Self-pay | Admitting: Dermatology

## 2023-02-18 ENCOUNTER — Telehealth: Payer: Self-pay

## 2023-02-18 NOTE — Telephone Encounter (Signed)
New patient. Broken english. Patient states she feels bumps inside vagina. Requesting to make an appointment.
